# Patient Record
Sex: Female | Born: 1935 | Race: White | Hispanic: No | Smoking: Never smoker
Health system: Southern US, Community
[De-identification: ages and names within clinical notes are randomized; demographics above are authoritative.]

## PROBLEM LIST (undated history)

## (undated) DIAGNOSIS — Z5189 Encounter for other specified aftercare: Secondary | ICD-10-CM

## (undated) DIAGNOSIS — I10 Essential (primary) hypertension: Secondary | ICD-10-CM

## (undated) DIAGNOSIS — E079 Disorder of thyroid, unspecified: Secondary | ICD-10-CM

## (undated) HISTORY — DX: Encounter for other specified aftercare: Z51.89

## (undated) HISTORY — PX: APPENDECTOMY: SHX54

## (undated) HISTORY — PX: ABDOMINAL HYSTERECTOMY: SHX81

## (undated) HISTORY — PX: TONSILLECTOMY: SUR1361

---

## 2015-09-19 ENCOUNTER — Encounter (HOSPITAL_BASED_OUTPATIENT_CLINIC_OR_DEPARTMENT_OTHER): Payer: Self-pay | Admitting: *Deleted

## 2015-09-19 ENCOUNTER — Emergency Department (HOSPITAL_BASED_OUTPATIENT_CLINIC_OR_DEPARTMENT_OTHER)
Admission: EM | Admit: 2015-09-19 | Discharge: 2015-09-19 | Disposition: A | Discharge status: Home / Self Care | Payer: Medicare Other | Attending: Emergency Medicine | Admitting: Emergency Medicine

## 2015-09-19 ENCOUNTER — Emergency Department (HOSPITAL_BASED_OUTPATIENT_CLINIC_OR_DEPARTMENT_OTHER): Payer: Medicare Other

## 2015-09-19 DIAGNOSIS — S80212A Abrasion, left knee, initial encounter: Secondary | ICD-10-CM | POA: Insufficient documentation

## 2015-09-19 DIAGNOSIS — W0110XA Fall on same level from slipping, tripping and stumbling with subsequent striking against unspecified object, initial encounter: Secondary | ICD-10-CM | POA: Insufficient documentation

## 2015-09-19 DIAGNOSIS — T07XXXA Unspecified multiple injuries, initial encounter: Secondary | ICD-10-CM

## 2015-09-19 DIAGNOSIS — Y929 Unspecified place or not applicable: Secondary | ICD-10-CM | POA: Insufficient documentation

## 2015-09-19 DIAGNOSIS — Z79899 Other long term (current) drug therapy: Secondary | ICD-10-CM | POA: Insufficient documentation

## 2015-09-19 DIAGNOSIS — Y939 Activity, unspecified: Secondary | ICD-10-CM | POA: Insufficient documentation

## 2015-09-19 DIAGNOSIS — S0081XA Abrasion of other part of head, initial encounter: Secondary | ICD-10-CM | POA: Insufficient documentation

## 2015-09-19 DIAGNOSIS — Y999 Unspecified external cause status: Secondary | ICD-10-CM | POA: Diagnosis not present

## 2015-09-19 DIAGNOSIS — S022XXA Fracture of nasal bones, initial encounter for closed fracture: Secondary | ICD-10-CM | POA: Insufficient documentation

## 2015-09-19 DIAGNOSIS — S8992XA Unspecified injury of left lower leg, initial encounter: Secondary | ICD-10-CM

## 2015-09-19 DIAGNOSIS — S0993XA Unspecified injury of face, initial encounter: Secondary | ICD-10-CM | POA: Diagnosis present

## 2015-09-19 DIAGNOSIS — I10 Essential (primary) hypertension: Secondary | ICD-10-CM | POA: Insufficient documentation

## 2015-09-19 DIAGNOSIS — W19XXXA Unspecified fall, initial encounter: Secondary | ICD-10-CM

## 2015-09-19 DIAGNOSIS — S0083XA Contusion of other part of head, initial encounter: Secondary | ICD-10-CM | POA: Insufficient documentation

## 2015-09-19 HISTORY — DX: Disorder of thyroid, unspecified: E07.9

## 2015-09-19 HISTORY — DX: Essential (primary) hypertension: I10

## 2015-09-19 MED ORDER — TRAMADOL HCL 50 MG PO TABS: 50.0000 mg | ORAL_TABLET | Freq: Four times a day (QID) | ORAL | 0 refills | Status: DC | PRN

## 2015-09-19 MED FILL — traMADol HCL 50 MG TABS: 50 | 3 days supply | Qty: 15 | Fill #0

## 2015-09-19 NOTE — ED Provider Notes (Signed)
[facility] Provider Note   CSN: [id number] Arrival date & time: 09/19/15  1002  First Provider Contact:  First MD Initiated Contact with Patient 09/19/15 1010        History   Chief Complaint Chief Complaint  Patient presents with  . Fall  . Facial Injury    HPI Jennifer Gallegos is a 82 y.o. female.  82 year old the female was walking her dog stumbled and fell on the concrete hitting her face and left knee. No loss of consciousness. Patient's tetanus is up-to-date. Patient states not on any blood thinners. Patient had bleeding from her right side of her nose. That is now stopped. Patient denies any neck pain back pain. Patient was able to get up on her own and walk after the fall. Denies any hip pain. No abdominal pain no chest pain no shortness of breath.      Past Medical History:  Diagnosis Date  . Hypertension   . Thyroid disease     There are no active problems to display for this patient.   Past Surgical History:  Procedure Laterality Date  . ABDOMINAL HYSTERECTOMY    . APPENDECTOMY    . TONSILLECTOMY      OB History    No data available       Home Medications    Prior to Admission medications   Medication Sig Start Date End Date Taking? Authorizing Provider  amLODipine-benazepril (LOTREL) 5-10 MG capsule Take 1 capsule by mouth daily.   Yes Historical Provider, MD  levothyroxine (SYNTHROID, LEVOTHROID) 88 MCG tablet Take 88 mcg by mouth daily before breakfast.   Yes Historical Provider, MD  traMADol (ULTRAM) 50 MG tablet Take 1 tablet (50 mg total) by mouth every 6 (six) hours as needed. 09/19/15   Vanetta Mulders, MD    Family History No family history on file.  Social History Social History  Substance Use Topics  . Smoking status: Not on file  . Smokeless tobacco: Never Used  . Alcohol use Not on file     Allergies   Penicillins; Macrobid [nitrofurantoin]; and Sulfa antibiotics   Review of Systems Review of Systems    Constitutional: Negative for fever.  HENT: Positive for hearing loss and nosebleeds. Negative for congestion.   Eyes: Negative for visual disturbance.  Respiratory: Negative for shortness of breath.   Cardiovascular: Negative for chest pain.  Gastrointestinal: Negative for abdominal pain.  Genitourinary: Negative for dysuria.  Musculoskeletal: Negative for back pain and neck pain.  Skin: Positive for wound.  Neurological: Negative for headaches.  Hematological: Does not bruise/bleed easily.  Psychiatric/Behavioral: Negative for confusion.     Physical Exam Updated Vital Signs BP 198/79 (BP Location: Right Arm)   Pulse 83   Temp 97.7 F (36.5 C) (Oral)   Resp 18   Ht 5\' 4"  (1.626 m)   Wt 48.5 kg   BMI 18.37 kg/m   Physical Exam  Constitutional: She is oriented to person, place, and time. She appears well-developed and well-nourished. No distress.  HENT:  Head: Normocephalic.  Mouth/Throat: Oropharynx is clear and moist.  Multiple abrasions anterior part of the face bilateral ecchymosis below both eyes. Abrasion of the forehead several abrasions to the bridge of the nose. No active bleeding from the nares. No septal hematoma. Oropharynx is clear. Patient also with abrasions on the chin.  Eyes: Conjunctivae and EOM are normal. Pupils are equal, round, and reactive to light.  No anterior hyphemas  Neck: Normal range of motion. Neck  supple.  Cardiovascular: Normal rate, regular rhythm and normal heart sounds.   Pulmonary/Chest: Effort normal and breath sounds normal.  Abdominal: Soft. Bowel sounds are normal. There is no tenderness.  Musculoskeletal: She exhibits edema and tenderness.  Swelling and abrasion to the left anterior knee. No evidence of significant effusion. Good range of motion. No patellar deformity.  Neurological: She is alert and oriented to person, place, and time. She displays normal reflexes. No cranial nerve deficit. She exhibits normal muscle tone.  Patient  does have bilateral hearing aids so technically he does have a hearing deficit. But hears well with the hearing aids.  Skin: Skin is warm.  Nursing note and vitals reviewed.    ED Treatments / Results  Labs (all labs ordered are listed, but only abnormal results are displayed) Labs Reviewed - No data to display  EKG  EKG Interpretation None       Radiology Ct Head Wo Contrast  Result Date: 09/19/2015 CLINICAL DATA:  Trip and fall with facial swelling and abrasions, initial encounter EXAM: CT HEAD WITHOUT CONTRAST CT MAXILLOFACIAL WITHOUT CONTRAST CT CERVICAL SPINE WITHOUT CONTRAST TECHNIQUE: Multidetector CT imaging of the head, cervical spine, and maxillofacial structures were performed using the standard protocol without intravenous contrast. Multiplanar CT image reconstructions of the cervical spine and maxillofacial structures were also generated. COMPARISON:  None. FINDINGS: CT HEAD FINDINGS Bony calvarium is intact. Mild left forehead hematoma is seen. Diffuse atrophic changes are noted. Chronic white matter ischemic change is seen as well. Basal ganglia calcifications are noted. No findings to suggest acute hemorrhage, acute infarction or space-occupying mass lesion are seen. CT MAXILLOFACIAL FINDINGS Multiple nasal bone fractures are noted with soft tissue swelling over the bridge of the nose. No other fracture is identified. Paranasal sinuses are within normal limits. The orbits and their contents are unremarkable. No other significant soft tissue abnormality is seen. CT CERVICAL SPINE FINDINGS Seven cervical segments are well visualized. Vertebral body height is well maintained. Mild facet hypertrophic changes are noted. No acute fracture or acute facet abnormality is seen. The surrounding soft tissue structures demonstrate evidence of a large 6.3 by 4.9 cm left thyroid mass. By history patient has known thyroid disease. Clinical correlation is recommended. IMPRESSION: CT of the head:  Atrophic and ischemic changes without acute intracranial abnormality. CT of the maxillofacial bones: Multiple nasal bone fractures are noted with overlying soft tissue swelling. A left frontal soft tissue hematoma is noted as well. CT of the cervical spine: Degenerative change without acute bony abnormality. A large left thyroid mass is noted. Correlation with the clinical history is recommended. Electronically Signed   By: Alcide CleverMark  Lukens M.D.   On: 09/19/2015 11:20   Ct Cervical Spine Wo Contrast  Result Date: 09/19/2015 CLINICAL DATA:  Trip and fall with facial swelling and abrasions, initial encounter EXAM: CT HEAD WITHOUT CONTRAST CT MAXILLOFACIAL WITHOUT CONTRAST CT CERVICAL SPINE WITHOUT CONTRAST TECHNIQUE: Multidetector CT imaging of the head, cervical spine, and maxillofacial structures were performed using the standard protocol without intravenous contrast. Multiplanar CT image reconstructions of the cervical spine and maxillofacial structures were also generated. COMPARISON:  None. FINDINGS: CT HEAD FINDINGS Bony calvarium is intact. Mild left forehead hematoma is seen. Diffuse atrophic changes are noted. Chronic white matter ischemic change is seen as well. Basal ganglia calcifications are noted. No findings to suggest acute hemorrhage, acute infarction or space-occupying mass lesion are seen. CT MAXILLOFACIAL FINDINGS Multiple nasal bone fractures are noted with soft tissue swelling over the bridge  of the nose. No other fracture is identified. Paranasal sinuses are within normal limits. The orbits and their contents are unremarkable. No other significant soft tissue abnormality is seen. CT CERVICAL SPINE FINDINGS Seven cervical segments are well visualized. Vertebral body height is well maintained. Mild facet hypertrophic changes are noted. No acute fracture or acute facet abnormality is seen. The surrounding soft tissue structures demonstrate evidence of a large 6.3 by 4.9 cm left thyroid mass. By  history patient has known thyroid disease. Clinical correlation is recommended. IMPRESSION: CT of the head: Atrophic and ischemic changes without acute intracranial abnormality. CT of the maxillofacial bones: Multiple nasal bone fractures are noted with overlying soft tissue swelling. A left frontal soft tissue hematoma is noted as well. CT of the cervical spine: Degenerative change without acute bony abnormality. A large left thyroid mass is noted. Correlation with the clinical history is recommended. Electronically Signed   By: Alcide Clever M.D.   On: 09/19/2015 11:20   Dg Knee Complete 4 Views Left  Result Date: 09/19/2015 CLINICAL DATA:  Fall, tripped on cement EXAM: LEFT KNEE - COMPLETE 4+ VIEW COMPARISON:  None. FINDINGS: Four views of the left knee submitted. No acute fracture or subluxation. There is diffuse osteopenia. Small joint effusion. Prepatellar soft tissue swelling. Mild spurring of patella. Narrowing of medial joint compartment. Mild spurring of medial femoral condyle and medial tibial plateau. Narrowing of patellofemoral joint space. IMPRESSION: No acute fracture or subluxation. Small joint effusion. Diffuse osteopenia. Degenerative changes as described above. Prepatellar soft tissue swelling. Electronically Signed   By: Natasha Mead M.D.   On: 09/19/2015 11:15   Ct Maxillofacial Wo Cm  Result Date: 09/19/2015 CLINICAL DATA:  Trip and fall with facial swelling and abrasions, initial encounter EXAM: CT HEAD WITHOUT CONTRAST CT MAXILLOFACIAL WITHOUT CONTRAST CT CERVICAL SPINE WITHOUT CONTRAST TECHNIQUE: Multidetector CT imaging of the head, cervical spine, and maxillofacial structures were performed using the standard protocol without intravenous contrast. Multiplanar CT image reconstructions of the cervical spine and maxillofacial structures were also generated. COMPARISON:  None. FINDINGS: CT HEAD FINDINGS Bony calvarium is intact. Mild left forehead hematoma is seen. Diffuse atrophic  changes are noted. Chronic white matter ischemic change is seen as well. Basal ganglia calcifications are noted. No findings to suggest acute hemorrhage, acute infarction or space-occupying mass lesion are seen. CT MAXILLOFACIAL FINDINGS Multiple nasal bone fractures are noted with soft tissue swelling over the bridge of the nose. No other fracture is identified. Paranasal sinuses are within normal limits. The orbits and their contents are unremarkable. No other significant soft tissue abnormality is seen. CT CERVICAL SPINE FINDINGS Seven cervical segments are well visualized. Vertebral body height is well maintained. Mild facet hypertrophic changes are noted. No acute fracture or acute facet abnormality is seen. The surrounding soft tissue structures demonstrate evidence of a large 6.3 by 4.9 cm left thyroid mass. By history patient has known thyroid disease. Clinical correlation is recommended. IMPRESSION: CT of the head: Atrophic and ischemic changes without acute intracranial abnormality. CT of the maxillofacial bones: Multiple nasal bone fractures are noted with overlying soft tissue swelling. A left frontal soft tissue hematoma is noted as well. CT of the cervical spine: Degenerative change without acute bony abnormality. A large left thyroid mass is noted. Correlation with the clinical history is recommended. Electronically Signed   By: Alcide Clever M.D.   On: 09/19/2015 11:20    Procedures Procedures (including critical care time)  Medications Ordered in ED Medications - No  data to display   Initial Impression / Assessment and Plan / ED Course  I have reviewed the triage vital signs and the nursing notes.  Pertinent labs & imaging results that were available during my care of the patient were reviewed by me and considered in my medical decision making (see chart for details).  Clinical Course    Patient stumbled walking her dog. Landing on her face on the concrete. Patient was facial  abrasions and bruising as well as bleeding from the right nares which has stopped. Also complaint of mild discomfort to the left knee slight swelling there with an abrasion on the skin.  CT of the head and neck without any acute findings. X-ray of left knee without any bony injuries. CT of face shows multiple nasal bone fractures. No evidence of any open fracture by exam.  Patient will be discharged home with local wound care for the abrasions to the face and follow up with ear nose and throat. Prescription for tramadol provided. Patient did not have any loss of consciousness.  Final Clinical Impressions(s) / ED Diagnoses   Final diagnoses:  Fall, initial encounter  Multiple abrasions  Nasal bone fractures, closed, initial encounter  Knee injury, left, initial encounter    New Prescriptions New Prescriptions   TRAMADOL (ULTRAM) 50 MG TABLET    Take 1 tablet (50 mg total) by mouth every 6 (six) hours as needed.     Vanetta Mulders, MD 09/19/15 1157

## 2015-09-19 NOTE — ED Triage Notes (Signed)
Pt states she tripped on cement and fell face first to cement. Bruising to bil eyes and abrasion to bridge of nose, upper lip, chin, left palm of hand and left knee. Swelling to left knee. No LOC.

## 2015-09-19 NOTE — Discharge Instructions (Signed)
CT scan of the face shows multiple broken bones in the nose. Follow up with ear nose and throat is important. For the abrasions on the face local wound care. No evidence of any broken bone in the knee.

## 2017-10-27 DIAGNOSIS — R269 Unspecified abnormalities of gait and mobility: Secondary | ICD-10-CM | POA: Insufficient documentation

## 2017-10-27 DIAGNOSIS — M810 Age-related osteoporosis without current pathological fracture: Secondary | ICD-10-CM | POA: Insufficient documentation

## 2019-02-07 ENCOUNTER — Other Ambulatory Visit: Payer: Self-pay

## 2019-02-07 ENCOUNTER — Emergency Department (HOSPITAL_BASED_OUTPATIENT_CLINIC_OR_DEPARTMENT_OTHER)
Admission: EM | Admit: 2019-02-07 | Discharge: 2019-02-07 | Disposition: A | Discharge status: Home / Self Care | Payer: Medicare Other | Attending: Emergency Medicine | Admitting: Emergency Medicine

## 2019-02-07 ENCOUNTER — Encounter (HOSPITAL_BASED_OUTPATIENT_CLINIC_OR_DEPARTMENT_OTHER): Payer: Self-pay | Admitting: *Deleted

## 2019-02-07 ENCOUNTER — Emergency Department (HOSPITAL_BASED_OUTPATIENT_CLINIC_OR_DEPARTMENT_OTHER): Payer: Medicare Other

## 2019-02-07 DIAGNOSIS — R1084 Generalized abdominal pain: Secondary | ICD-10-CM | POA: Diagnosis present

## 2019-02-07 DIAGNOSIS — N390 Urinary tract infection, site not specified: Secondary | ICD-10-CM

## 2019-02-07 DIAGNOSIS — Z79899 Other long term (current) drug therapy: Secondary | ICD-10-CM | POA: Diagnosis not present

## 2019-02-07 DIAGNOSIS — I10 Essential (primary) hypertension: Secondary | ICD-10-CM | POA: Diagnosis not present

## 2019-02-07 DIAGNOSIS — K5900 Constipation, unspecified: Secondary | ICD-10-CM | POA: Insufficient documentation

## 2019-02-07 LAB — COMPREHENSIVE METABOLIC PANEL
ALT: 13 U/L (ref 0–44)
AST: 15 U/L (ref 15–41)
Albumin: 4.5 g/dL (ref 3.5–5.0)
Alkaline Phosphatase: 84 U/L (ref 38–126)
Anion gap: 12 (ref 5–15)
BUN: 20 mg/dL (ref 8–23)
CO2: 23 mmol/L (ref 22–32)
Calcium: 9.3 mg/dL (ref 8.9–10.3)
Chloride: 99 mmol/L (ref 98–111)
Creatinine, Ser: 1.06 mg/dL — ABNORMAL HIGH (ref 0.44–1.00)
GFR calc Af Amer: 51 mL/min — ABNORMAL LOW (ref >60)
GFR calc non Af Amer: 44 mL/min — ABNORMAL LOW (ref >60)
Glucose, Bld: 171 mg/dL — ABNORMAL HIGH (ref 70–99)
Potassium: 3.8 mmol/L (ref 3.5–5.1)
Sodium: 134 mmol/L — ABNORMAL LOW (ref 135–145)
Total Bilirubin: 0.7 mg/dL (ref 0.3–1.2)
Total Protein: 7.6 g/dL (ref 6.5–8.1)

## 2019-02-07 LAB — CBC
HCT: 47.2 % — ABNORMAL HIGH (ref 36.0–46.0)
Hemoglobin: 15.5 g/dL — ABNORMAL HIGH (ref 12.0–15.0)
MCH: 29.6 pg (ref 26.0–34.0)
MCHC: 32.8 g/dL (ref 30.0–36.0)
MCV: 90.1 fL (ref 80.0–100.0)
Platelets: 308 10*3/uL (ref 150–400)
RBC: 5.24 MIL/uL — ABNORMAL HIGH (ref 3.87–5.11)
RDW: 13.4 % (ref 11.5–15.5)
WBC: 11.5 10*3/uL — ABNORMAL HIGH (ref 4.0–10.5)
nRBC: 0 % (ref 0.0–0.2)

## 2019-02-07 LAB — URINALYSIS, ROUTINE W REFLEX MICROSCOPIC
Bilirubin Urine: NEGATIVE
Glucose, UA: 100 mg/dL — AB
Ketones, ur: NEGATIVE mg/dL
Nitrite: POSITIVE — AB
Protein, ur: 30 mg/dL — AB
Specific Gravity, Urine: 1.025 (ref 1.005–1.030)
pH: 5.5 (ref 5.0–8.0)

## 2019-02-07 LAB — URINALYSIS, MICROSCOPIC (REFLEX): WBC, UA: >50 WBC/hpf (ref 0–5)

## 2019-02-07 LAB — LIPASE, BLOOD: Lipase: 31 U/L (ref 11–51)

## 2019-02-07 MED ORDER — CEPHALEXIN 500 MG PO CAPS: 500.0000 mg | ORAL_CAPSULE | Freq: Three times a day (TID) | ORAL | 0 refills | Status: AC

## 2019-02-07 MED ORDER — IOHEXOL 300 MG/ML  SOLN
100.0000 mL | Freq: Once | INTRAMUSCULAR | Status: AC | PRN
  Administered 2019-02-07: 100 mL via INTRAVENOUS

## 2019-02-07 NOTE — ED Notes (Signed)
DC instructions and Rx reviewed with pt son, due to pt being very HOH. Rx x 1 provided to son and opportunity for questions provided

## 2019-02-07 NOTE — ED Notes (Signed)
C/o abd pain since christmas eve. Denies any N/V/D. States she has been constipated and trying OTC meds w/o results

## 2019-02-07 NOTE — Discharge Instructions (Addendum)
Recommend taking MiraLAX twice daily for the next week.  Recommend taking the antibiotic as prescribed.  Recommend calling primary doctor to schedule a recheck in 1 week.  Additionally recommend scheduling follow-up with a gastroenterologist.  If you develop worsening pain, fever or other new concerning symptom, recommend return to ER for reassessment.

## 2019-02-07 NOTE — ED Notes (Signed)
INSTRUCTED TO BE NPO UNTIL FURTHER NOTICE

## 2019-02-07 NOTE — ED Provider Notes (Signed)
[place] EMERGENCY DEPARTMENT Provider Note   CSN: [id number] Arrival date & time: 02/07/19  1210     History Chief Complaint  Patient presents with  . Abdominal Pain  . Back Pain  . Constipation    Jennifer Gallegos is a 85 y.o. female.  Presents to the emergency department with chief complaint of abdominal pain, back pain and constipation.  Patient states symptoms have been going on for last few days, seem to come and go at random, describes pain as generalized, cramping.  Also concerned she has been more constipated than normal.  Had tried something over-the-counter without improvement.  Cannot recall what medication it was.  Last bowel movement 5 days ago.  No nausea, vomiting or diarrhea.  States at present she has no ongoing pain.  No dysuria or hematuria.  HPI     Past Medical History:  Diagnosis Date  . Hypertension   . Thyroid disease     There are no problems to display for this patient.   Past Surgical History:  Procedure Laterality Date  . ABDOMINAL HYSTERECTOMY    . APPENDECTOMY    . TONSILLECTOMY       OB History   No obstetric history on file.     No family history on file.  Social History   Tobacco Use  . Smoking status: Never Smoker  . Smokeless tobacco: Never Used  Substance Use Topics  . Alcohol use: Never  . Drug use: Never    Home Medications Prior to Admission medications   Medication Sig Start Date End Date Taking? Authorizing Provider  amLODipine-benazepril (LOTREL) 5-10 MG capsule Take 1 capsule by mouth daily.   Yes [provider]  levothyroxine (SYNTHROID, LEVOTHROID) 88 MCG tablet Take 88 mcg by mouth daily before breakfast.   Yes [provider]  cephALEXin (KEFLEX) 500 MG capsule Take 1 capsule (500 mg total) by mouth 3 (three) times daily for 7 days. 02/07/19 02/14/19  Lucrezia Starch, MD  traMADol (ULTRAM) 50 MG tablet Take 1 tablet (50 mg total) by mouth every 6 (six) hours as needed. 09/19/15    Fredia Sorrow, MD    Allergies    Penicillins, Macrobid [nitrofurantoin], and Sulfa antibiotics  Review of Systems   Review of Systems  Constitutional: Negative for chills and fever.  HENT: Negative for ear pain and sore throat.   Eyes: Negative for pain and visual disturbance.  Respiratory: Negative for cough and shortness of breath.   Cardiovascular: Negative for chest pain and palpitations.  Gastrointestinal: Positive for abdominal pain and constipation. Negative for vomiting.  Genitourinary: Negative for dysuria and hematuria.  Musculoskeletal: Negative for arthralgias and back pain.  Skin: Negative for color change and rash.  Neurological: Negative for seizures and syncope.  All other systems reviewed and are negative.   Physical Exam Updated Vital Signs BP 140/89 (BP Location: Right Arm)   Pulse 89   Temp 99 F (37.2 C) (Oral)   Resp 17   Ht 5' (1.524 m)   Wt 44.5 kg   SpO2 98%   BMI 19.14 kg/m   Physical Exam Vitals and nursing note reviewed.  Constitutional:      General: She is not in acute distress.    Appearance: She is well-developed.  HENT:     Head: Normocephalic and atraumatic.  Eyes:     Conjunctiva/sclera: Conjunctivae normal.  Cardiovascular:     Rate and Rhythm: Normal rate and regular rhythm.     Heart sounds:  No murmur.  Pulmonary:     Effort: Pulmonary effort is normal. No respiratory distress.     Breath sounds: Normal breath sounds.  Abdominal:     General: Bowel sounds are normal.     Palpations: Abdomen is soft.     Tenderness: There is no abdominal tenderness. There is no right CVA tenderness or left CVA tenderness.  Musculoskeletal:     Cervical back: Neck supple.  Skin:    General: Skin is warm and dry.  Neurological:     Mental Status: She is alert.     ED Results / Procedures / Treatments   Labs (all labs ordered are listed, but only abnormal results are displayed) Labs Reviewed  COMPREHENSIVE METABOLIC PANEL -  Abnormal; Notable for the following components:      Result Value   Sodium 134 (*)    Glucose, Bld 171 (*)    Creatinine, Ser 1.06 (*)    GFR calc non Af Amer 44 (*)    GFR calc Af Amer 51 (*)    All other components within normal limits  CBC - Abnormal; Notable for the following components:   WBC 11.5 (*)    RBC 5.24 (*)    Hemoglobin 15.5 (*)    HCT 47.2 (*)    All other components within normal limits  URINALYSIS, ROUTINE W REFLEX MICROSCOPIC - Abnormal; Notable for the following components:   APPearance CLOUDY (*)    Glucose, UA 100 (*)    Hgb urine dipstick SMALL (*)    Protein, ur 30 (*)    Nitrite POSITIVE (*)    Leukocytes,Ua MODERATE (*)    All other components within normal limits  URINALYSIS, MICROSCOPIC (REFLEX) - Abnormal; Notable for the following components:   Bacteria, UA MANY (*)    All other components within normal limits  LIPASE, BLOOD    EKG None  Radiology CT ABDOMEN PELVIS W CONTRAST  Result Date: 02/07/2019 CLINICAL DATA:  Left upper quadrant abdominal pain. Concern for diverticulitis or pancreatitis. EXAM: CT ABDOMEN AND PELVIS WITH CONTRAST TECHNIQUE: Multidetector CT imaging of the abdomen and pelvis was performed using the standard protocol following bolus administration of intravenous contrast. CONTRAST:  100mL OMNIPAQUE IOHEXOL 300 MG/ML  SOLN COMPARISON:  None. FINDINGS: Lower chest: There is atelectasis at the lung bases.The heart is enlarged. Hepatobiliary: The liver is normal. Normal gallbladder.There is no biliary ductal dilation. Pancreas: Normal contours without ductal dilatation. No peripancreatic fluid collection. Spleen: No splenic laceration or hematoma. Adrenals/Urinary Tract: --Adrenal glands: No adrenal hemorrhage. --Right kidney/ureter: No hydronephrosis or perinephric hematoma. --Left kidney/ureter: No hydronephrosis or perinephric hematoma. --Urinary bladder: Unremarkable. Stomach/Bowel: --Stomach/Duodenum: No hiatal hernia or other  gastric abnormality. Normal duodenal course and caliber. --Small bowel: No dilatation or inflammation. --Colon: There is sigmoid diverticulosis without CT evidence for diverticulitis. There is a large amount of stool throughout the colon. There is a focal long segment area of narrowing involving the transverse colon (axial series 2, image 48). --Appendix: Not visualized. No right lower quadrant inflammation or free fluid. Vascular/Lymphatic: Atherosclerotic calcification is present within the non-aneurysmal abdominal aorta, without hemodynamically significant stenosis. --No retroperitoneal lymphadenopathy. --No mesenteric lymphadenopathy. --No pelvic or inguinal lymphadenopathy. Reproductive: Status post hysterectomy. No adnexal mass. There is significant pelvic floor laxity. Other: No ascites or free air. The abdominal wall is normal. Musculoskeletal. There is chronic appearing height loss of the L3 and L1 vertebral bodies. There is no significant retropulsion. There is no obvious acute displaced fracture on this  exam. IMPRESSION: 1. No acute abdominopelvic abnormality. 2. Large amount of stool throughout the colon. There is a focal long segment area of narrowing involving the transverse colon. While this could be secondary to underdistention, a colonic mass is not excluded. Correlation with colonoscopy is recommended. 3. Sigmoid diverticulosis without CT evidence for diverticulitis. 4. Large stool burden. 5. Cardiomegaly. 6. Pelvic floor laxity. 7. Chronic appearing height loss of the L3 and L1 vertebral bodies. Aortic Atherosclerosis (ICD10-I70.0). Electronically Signed   By: Katherine Mantle M.D.   On: 02/07/2019 16:44    Procedures Procedures (including critical care time)  Medications Ordered in ED Medications  iohexol (OMNIPAQUE) 300 MG/ML solution 100 mL (100 mLs Intravenous Contrast Given 02/07/19 1624)    ED Course  I have reviewed the triage vital signs and the nursing notes.  Pertinent  labs & imaging results that were available during my care of the patient were reviewed by me and considered in my medical decision making (see chart for details).    MDM Rules/Calculators/A&P                      85 year old lady presents to ER with abdominal pain.  On exam she is well-appearing, no focal tenderness noted, her pain had actually resolved when I performed my initial assessment.  Given age, proceeded with CT scan to evaluate for any acute pathology.  No acute abdominal pelvic pathology noted.  Radiologist did note large stool burden in colon.  Suspect this is the most likely etiology for her symptoms.  Recommended aggressive bowel regimen with MiraLAX or milk of magnesia.  Additionally recommended recheck with primary doctor.  The radiologist also noted focal long segment area of narrowing involving transverse colon and colonic mass cannot be excluded.  Discussed this finding with patient and her son, recommended discussion with gastroenterology and consideration for colonoscopy.  UA concerning for possible infection. Tolerating PO without difficulty. Will dc home.  Will give rx for pos UTI.    After the discussed management above, the patient was determined to be safe for discharge.  The patient was in agreement with this plan and all questions regarding their care were answered.  ED return precautions were discussed and the patient will return to the ED with any significant worsening of condition.   Final Clinical Impression(s) / ED Diagnoses Final diagnoses:  Constipation, unspecified constipation type  Urinary tract infection without hematuria, site unspecified    Rx / DC Orders ED Discharge Orders         Ordered    cephALEXin (KEFLEX) 500 MG capsule  3 times daily     02/07/19 1712           Milagros Loll, MD 02/07/19 2345

## 2019-02-07 NOTE — ED Triage Notes (Signed)
Abdominal pain, back pain and constipation. Last bowel movement was 5 days ago.

## 2019-02-07 NOTE — ED Notes (Signed)
Also states last BM was not usual for her. Very sm and very hard per her statement

## 2019-02-07 NOTE — ED Notes (Signed)
Family at bedside. 

## 2019-02-07 NOTE — ED Notes (Signed)
Returns from CT scan, Hourly Rounding performed.

## 2019-02-07 NOTE — ED Notes (Signed)
ED Provider at bedside. 

## 2019-02-18 NOTE — Progress Notes (Signed)
02/18/2019 Jennifer Gallegos [id number] May 07, 1919   HISTORY OF PRESENT ILLNESS: Jennifer Gallegos is a 86 year old female with a past medical history of hypertension and  Hypothyroidism. Past tonsillectomy, appendectomy, hysterectomy and bladder lift surgery.  She developed upper abdominal pain on 02/02/2019.  No associated nausea or vomiting.  She took Ibuprofen 200 mg 1 tab every 3-4 hours which resulted in constipation. She usually passes a normal brown BM once or twice daily.  She went 2 to 3 days without passing a bowel movement.  Her upper abdominal pain which radiated under the right and left rib cage worsened and she presented to she to [facility] on 02/07/2019.  Labs showed WBC 11.5. Hg 15.5. HCT 47.2. PLT 308. Na 134. K 3.8. BUN 20. Cr. 1.06. T. Bili 07. Alk phos 84. AST 15. ALT 13. An abdominal/pelvic CT with contrast identified a large stool burden in the colon, sigmoid diverticulosis without evidence of diverticulitis and a focal long segment area of narrowing involving the transverse colon, a colonic mass could not be excluded. She was advised to follow up with GI for a possible colonoscopy. A urinalysis showed a possible UTI therefore she was prescribed Keflex 598m 1 po tid. she presents today accompanied by her son Jennifer Glaserfor further GI evaluation.  She continues to have pain across her upper abdomen which was worse yesterday evening.  She slept sitting up and she is having less abdominal pain today.  He describes the pain as sharp and sometimes a dull ache.  No fever, sweats or chills.  No nausea or vomiting.  She is no longer constipated.  She is taking MiraLAX daily.  She last passed a normal formed brown bowel movement yesterday.  She noticed a scant amount of red blood on the toilet tissue yesterday.  No melena.  No BM yet today. She denies ever having a screening colonoscopy. She denies having any dysphagia or heartburn.  No fever, sweats or chills.  No weight loss.  No  family history of stomach or colorectal cancer.  Her mother died in her 823sfrom liver or pancreatic cancer.  Abdominal/pelvic CT with contrast 02/07/2019: 1. No acute abdominopelvic abnormality. 2. Large amount of stool throughout the colon. There is a focal long segment area of narrowing involving the transverse colon. While this could be secondary to underdistention, a colonic mass is not excluded. Correlation with colonoscopy is recommended. 3. Sigmoid diverticulosis without CT evidence for diverticulitis. 4. Large stool burden. 5. Cardiomegaly. 6. Pelvic floor laxity. 7. Chronic appearing height loss of the L3 and L1 vertebral bodies. 8. Aortic Atherosclerosis   Past Medical History:  Diagnosis Date  . Hypertension   . Thyroid disease    Past Surgical History:  Procedure Laterality Date  . ABDOMINAL HYSTERECTOMY    . APPENDECTOMY    . TONSILLECTOMY      reports that she has never smoked. She has never used smokeless tobacco. She reports that she does not drink alcohol or use drugs. family history is not on file. Allergies  Allergen Reactions  . Penicillins Itching  . Macrobid [Nitrofurantoin] Rash  . Sulfa Antibiotics Rash      Outpatient Encounter Medications as of 02/20/2019  Medication Sig  . amLODipine-benazepril (LOTREL) 5-10 MG capsule Take 1 capsule by mouth daily.  .Marland Kitchenlevothyroxine (SYNTHROID, LEVOTHROID) 88 MCG tablet Take 88 mcg by mouth daily before breakfast.  . traMADol (ULTRAM) 50 MG tablet Take 1 tablet (50 mg total) by  mouth every 6 (six) hours as needed.   No facility-administered encounter medications on file as of 02/20/2019.    Review of Systems: Gen: Denies fever, sweats or chills. No weight loss.  CV: Denies chest pain, palpitations or edema. Resp: Denies cough, shortness of breath of hemoptysis.  GI: See HPI. GU : + Urinary incontinence, no dysuria. MS: Denies joint pain, muscles aches or weakness. Derm: Denies rash, itchiness, skin lesions  or unhealing ulcers. Psych: Denies depression, Jennifer Gallegos or memory loss. Heme: Denies bruising, or easy bleeding. Neuro:  Denies headaches, dizziness or paresthesias. Endo:  Denies any problems with DM, thyroid or adrenal function.   PHYSICAL EXAM: BP 124/60   Pulse 75   Temp 97.8 F (36.6 C)   Wt 99 lb (44.9 kg)   BMI 19.33 kg/m  General: Well developed 86 year old female HOH in no acute distress Head: Normocephalic and atraumatic Eyes:  sclerae anicteric, conjunctive pink. Ears: HOH. Mouth: Upper denture plate. No ulcers or lesions.  Neck: Supple, no masses.  Lungs: Clear throughout to auscultation Heart: Regular rate and rhythm, II/VI systolic murmur.  Abdomen: Soft, flat, nontender, non distended. No masses or hepatomegaly noted. Normal bowel sounds x 4 quadrants.  Rectal: Deferred.  Musculoskeletal: Symmetrical with no gross deformities  Skin: No lesions on visible extremities Extremities: No edema  Neurological: Alert oriented x 4, grossly nonfocal Cervical Nodes:  No significant cervical adenopathy Psychological:  Alert and cooperative. Normal mood and affect  ASSESSMENT AND PLAN:  82. 86 year old female with constipation and upper abdominal pain. CTAP with contrast 12/29 identified stool burden throughout the colon, sigmoid diverticulosis and a long segment of narrowing to the transverse colon.  - I discussed her abdominal/pelvic CT results with the patient and her son Jennifer Gallegos which showed a narrowing to the transverse colon which is consistent with the area of her abdominal pain.  Typically, a colonoscopy would be deferred due to increased complications secondary to her age but due to her persistent upper abdominal pain a diagnostic colonoscopy to be considered +/- EGD.  I discussed the benefits and the risks of a diagnostic colonoscopy including risk with sedation, bleeding, infection and perforation.  I asked the patient if she was diagnosed with a colon cancer would she want to  know this diagnosis and would she want treatment/surgical intervention? The patient stated her abdominal pain is less today and she prefers to avoid any endoscopic evaluation unless absolutely necessary.  She will discuss this further with her extended family.  I also discussed repeating an abdominal/pelvic CT in 4 weeks to reassess the narrowing component to this transverse colon.  I discussed the signs of an abdominal obstruction including worsening abdominal pain, vomiting and no bowel movement or gas per the rectum and if this were to occur for the patient to present to the local emergency room. -CBC, CRP, BMP -MiraLAX 1 capful mixed in 8 ounces of water daily -I will consult with Dr. Bryan Lemma to obtain his recommendations -Patient to call our office if her abdominal pain worsens or if she develops vomiting or if constipation recurs -Follow-up in the office in 4 weeks  2. Sigmoid diverticulosis   3. Hypertension, well controlled        CC:  Kristopher Glee., MD

## 2019-02-20 ENCOUNTER — Other Ambulatory Visit: Payer: Self-pay

## 2019-02-20 ENCOUNTER — Ambulatory Visit (INDEPENDENT_AMBULATORY_CARE_PROVIDER_SITE_OTHER): Payer: Medicare Other | Admitting: Nurse Practitioner

## 2019-02-20 ENCOUNTER — Encounter: Payer: Self-pay | Admitting: Nurse Practitioner

## 2019-02-20 VITALS — BP 124/60 | HR 75 | Temp 97.8°F | Wt 99.0 lb

## 2019-02-20 DIAGNOSIS — K5900 Constipation, unspecified: Secondary | ICD-10-CM | POA: Diagnosis not present

## 2019-02-20 DIAGNOSIS — K56699 Other intestinal obstruction unspecified as to partial versus complete obstruction: Secondary | ICD-10-CM | POA: Diagnosis not present

## 2019-02-20 DIAGNOSIS — R935 Abnormal findings on diagnostic imaging of other abdominal regions, including retroperitoneum: Secondary | ICD-10-CM | POA: Diagnosis not present

## 2019-02-20 DIAGNOSIS — R1010 Upper abdominal pain, unspecified: Secondary | ICD-10-CM | POA: Diagnosis not present

## 2019-02-20 NOTE — Progress Notes (Signed)
Agree with the assessment and plan as outlined by Alcide Evener, NP.   CT independently reviewed and agree that there appears to be an area of narrowing in the transverse colon. Given patient wishes to defer any endoscopic evaluations, agree with plan for medical management of constipation with short interval (4 weeks) repeat CT to assess for changes. If ongoing sxs, or if CT similarly concerning for mass effect, would have a low threshold for colonoscopy for diagnostic intent, provided the patient and her family wish to proceed and that she would also consider surgical intervention and/or endoscopic stent placement depending on the findings, as she would remain at risk of obstruction should this be a more ominous process.   Shemeca Lukasik, DO, [facility]

## 2019-02-20 NOTE — Patient Instructions (Addendum)
If you are age 84 or older, your body mass index should be between 23-30. Your There is no height or weight on file to calculate BMI. If this is out of the aforementioned range listed, please consider follow up with your Primary Care Provider.  If you are age 31 or younger, your body mass index should be between 19-25. Your There is no height or weight on file to calculate BMI. If this is out of the aformentioned range listed, please consider follow up with your Primary Care Provider.   Your provider has requested that you have lab work done today. Please go to [facility] at [place]. [address] [place], [place] [address].  Please use Tylenol 500 mg by mouth twice a day as needed. Use Miralax daily 1 capful mixed in 8 ounces of water at bedtime. Follow up in 4 weeks. Call the office if your symptoms worsen.  You have been scheduled for a CT scan of the abdomen and pelvis at [place] [facility]. You are scheduled on 03/20/2019 at 10:00am. You should arrive 15 minutes prior to your appointment time for registration. Please follow the written instructions below on the day of your exam:  WARNING: IF YOU ARE ALLERGIC TO IODINE/X-RAY DYE, PLEASE NOTIFY RADIOLOGY IMMEDIATELY AT [phone]! YOU WILL BE GIVEN A 13 HOUR PREMEDICATION PREP.  1) Do not eat or drink anything after 6:00am (4 hours prior to your test) 2) You have been given 2 bottles of oral contrast to drink. The solution may taste better if refrigerated, but do NOT add ice or any other liquid to this solution. Shake well before drinking.    Drink 1 bottle of contrast @ 8:00 am (2 hours prior to your exam)  Drink 1 bottle of contrast @ 9:00 am (1 hour prior to your exam)  You may take any medications as prescribed with a small amount of water, if necessary. If you take any of the following medications: METFORMIN, GLUCOPHAGE, GLUCOVANCE, AVANDAMET, RIOMET, FORTAMET, [place] MET, JANUMET, GLUMETZA or METAGLIP, you MAY be asked  to HOLD this medication 48 hours AFTER the exam.  The purpose of you drinking the oral contrast is to aid in the visualization of your intestinal tract. The contrast solution may cause some diarrhea. Depending on your individual set of symptoms, you may also receive an intravenous injection of x-ray contrast/dye. Plan on being at [facility] for 30 minutes or longer, depending on the type of exam you are having performed.  This test typically takes 30-45 minutes to complete.  If you have any questions regarding your exam or if you need to reschedule, you may call the CT department at [phone] between the hours of 8:00 am and 5:00 pm, Monday-Friday.  ________________________________________________________________________    Due to recent changes in healthcare laws, you may see the results of your imaging and laboratory studies on MyChart before your provider has had a chance to review them.  We understand that in some cases there may be results that are confusing or concerning to you. Not all laboratory results come back in the same time frame and the provider may be waiting for multiple results in order to interpret others.  Please give [place] 48 hours in order for your provider to thoroughly review all the results before contacting the office for clarification of your results.   Thank you for choosing [facility] Noralyn Pick, CRNP

## 2019-02-20 NOTE — Addendum Note (Signed)
Addended by: Shellia Cleverly on: 02/20/2019 02:06 PM   Modules accepted: Level of Service

## 2019-02-27 ENCOUNTER — Encounter (HOSPITAL_BASED_OUTPATIENT_CLINIC_OR_DEPARTMENT_OTHER): Payer: Self-pay

## 2019-02-27 ENCOUNTER — Other Ambulatory Visit: Payer: Self-pay

## 2019-02-27 ENCOUNTER — Emergency Department (HOSPITAL_BASED_OUTPATIENT_CLINIC_OR_DEPARTMENT_OTHER)
Admission: EM | Admit: 2019-02-27 | Discharge: 2019-02-27 | Disposition: A | Discharge status: Home / Self Care | Payer: Medicare Other | Attending: Emergency Medicine | Admitting: Emergency Medicine

## 2019-02-27 DIAGNOSIS — E079 Disorder of thyroid, unspecified: Secondary | ICD-10-CM | POA: Insufficient documentation

## 2019-02-27 DIAGNOSIS — L03115 Cellulitis of right lower limb: Secondary | ICD-10-CM | POA: Insufficient documentation

## 2019-02-27 DIAGNOSIS — I10 Essential (primary) hypertension: Secondary | ICD-10-CM | POA: Insufficient documentation

## 2019-02-27 DIAGNOSIS — L989 Disorder of the skin and subcutaneous tissue, unspecified: Secondary | ICD-10-CM | POA: Diagnosis present

## 2019-02-27 DIAGNOSIS — L0390 Cellulitis, unspecified: Secondary | ICD-10-CM

## 2019-02-27 DIAGNOSIS — R229 Localized swelling, mass and lump, unspecified: Secondary | ICD-10-CM

## 2019-02-27 MED ORDER — CEPHALEXIN 500 MG PO CAPS: 500.0000 mg | ORAL_CAPSULE | Freq: Two times a day (BID) | ORAL | 0 refills | Status: AC

## 2019-02-27 NOTE — ED Provider Notes (Signed)
[facility] Emergency Department Provider Note MRN:  [id number]  Arrival date & time: 02/27/19     Chief Complaint   Abscess   History of Present Illness   Jennifer Gallegos is a 86 y.o. year-old female with a history of hypertension presenting to the ED with chief complaint of abscess.  Lesion to the right leg for 1 week, increasing pain to the area.  Denies fever, no nausea, no vomiting, no chest pain, no shortness of breath, no other complaints.  Pain is mild, worse with palpation.  Review of Systems  A complete 10 system review of systems was obtained and all systems are negative except as noted in the HPI and PMH.   Patient's Health History    Past Medical History:  Diagnosis Date  . Hypertension   . Thyroid disease     Past Surgical History:  Procedure Laterality Date  . ABDOMINAL HYSTERECTOMY    . APPENDECTOMY    . TONSILLECTOMY      No family history on file.  Social History   Socioeconomic History  . Marital status: Widowed    Spouse name: Not on file  . Number of children: Not on file  . Years of education: Not on file  . Highest education level: Not on file  Occupational History  . Not on file  Tobacco Use  . Smoking status: Never Smoker  . Smokeless tobacco: Never Used  Substance and Sexual Activity  . Alcohol use: Never  . Drug use: Never  . Sexual activity: Not on file  Other Topics Concern  . Not on file  Social History Narrative  . Not on file   Social Determinants of Health   Financial Resource Strain:   . Difficulty of Paying Living Expenses: Not on file  Food Insecurity:   . Worried About Programme researcher, broadcasting/film/video in the Last Year: Not on file  . Ran Out of Food in the Last Year: Not on file  Transportation Needs:   . Lack of Transportation (Medical): Not on file  . Lack of Transportation (Non-Medical): Not on file  Physical Activity:   . Days of Exercise per Week: Not on file  . Minutes of Exercise per Session:  Not on file  Stress:   . Feeling of Stress : Not on file  Social Connections:   . Frequency of Communication with Friends and Family: Not on file  . Frequency of Social Gatherings with Friends and Family: Not on file  . Attends Religious Services: Not on file  . Active Member of Clubs or Organizations: Not on file  . Attends Banker Meetings: Not on file  . Marital Status: Not on file  Intimate Partner Violence:   . Fear of Current or Ex-Partner: Not on file  . Emotionally Abused: Not on file  . Physically Abused: Not on file  . Sexually Abused: Not on file     Physical Exam  Vital Signs and Nursing Notes reviewed Vitals:   02/27/19 1553 02/27/19 1712  BP: 135/65 (!) 141/72  Pulse: 95 64  Resp: 18 18  Temp: 98.4 F (36.9 C)   SpO2: 100% 97%    CONSTITUTIONAL: Well-appearing, NAD NEURO:  Alert and oriented x 3, no focal deficits EYES:  eyes equal and reactive ENT/NECK:  no LAD, no JVD CARDIO: Regular rate, well-perfused, normal S1 and S2 PULM:  CTAB no wheezing or rhonchi GI/GU:  normal bowel sounds, non-distended, non-tender MSK/SPINE:  No gross deformities, no  edema SKIN: 2 cm domed skin nodule to the right lower leg with mild surrounding erythematous base, central aspect of the nodule with abnormal color and texture PSYCH:  Appropriate speech and behavior  Diagnostic and Interventional Summary    EKG Interpretation  Date/Time:    Ventricular Rate:    PR Interval:    QRS Duration:   QT Interval:    QTC Calculation:   R Axis:     Text Interpretation:        Labs Reviewed - No data to display  No orders to display    Medications - No data to display   Procedures  /  Critical Care Ultrasound ED Soft Tissue  Date/Time: 02/27/2019 5:43 PM Performed by: Maudie Flakes, MD Authorized by: Maudie Flakes, MD   Procedure details:    Indications: localization of abscess and evaluate for cellulitis     Transverse view:  Visualized   Longitudinal  view:  Visualized   Images: archived   Location:    Location: lower extremity     Side:  Right Findings:     cellulitis present    ED Course and Medical Decision Making  I have reviewed the triage vital signs, the nursing notes, and pertinent available records from the EMR.  Pertinent labs & imaging results that were available during my care of the patient were reviewed by me and considered in my medical decision making (see below for details).     Atypical skin nodule not consistent with standard abscess.  Central crusting that is not expressing fluid.  Favoring basal or squamous cell carcinoma with possibly some surrounding inflammation or cellulitis.  Mild cobblestoning seen on bedside ultrasound, hypoechoic region seem to have some vascularity.  Given these concerns, incision and drainage not indicated, will cover with Keflex but advised close dermatology follow-up.    Barth Kirks. [place] Small, [place] [email or web address]  Final Clinical Impressions(s) / ED Diagnoses     ICD-10-CM   1. Skin nodule  R22.9   2. Cellulitis, unspecified cellulitis site  L03.90     ED Discharge Orders         Ordered    cephALEXin (KEFLEX) 500 MG capsule  2 times daily     02/27/19 1739           Discharge Instructions Discussed with and Provided to Patient:     Discharge Instructions     You were evaluated in the Emergency Department and after careful evaluation, we did not find any emergent condition requiring admission or further testing in the hospital.  The skin nodule on your leg has some abnormalities and needs to be evaluated by dermatologist.  There seems to be some surrounding infection.  Please take the Keflex antibiotic as directed.  Use Tylenol at home for pain.  We recommend continued warm compresses to the area.  Please return to the Emergency Department if you experience any worsening of your condition.  We encourage you to  follow up with a primary care provider.  Thank you for allowing [place] to be a part of your care.       Maudie Flakes, MD 02/27/19 [phone]

## 2019-02-27 NOTE — ED Triage Notes (Signed)
Pt c/o abscess like area to right LE x 1 week-NAD-steady gait with own cane

## 2019-02-27 NOTE — ED Notes (Signed)
ED Provider at bedside. 

## 2019-02-27 NOTE — Discharge Instructions (Signed)
You were evaluated in the Emergency Department and after careful evaluation, we did not find any emergent condition requiring admission or further testing in the hospital.  The skin nodule on your leg has some abnormalities and needs to be evaluated by dermatologist.  There seems to be some surrounding infection.  Please take the Keflex antibiotic as directed.  Use Tylenol at home for pain.  We recommend continued warm compresses to the area.  Please return to the Emergency Department if you experience any worsening of your condition.  We encourage you to follow up with a primary care provider.  Thank you for allowing [place] to be a part of your care.

## 2019-03-01 ENCOUNTER — Other Ambulatory Visit (INDEPENDENT_AMBULATORY_CARE_PROVIDER_SITE_OTHER): Payer: Medicare Other

## 2019-03-01 ENCOUNTER — Telehealth: Payer: Self-pay | Admitting: Gastroenterology

## 2019-03-01 DIAGNOSIS — R1010 Upper abdominal pain, unspecified: Secondary | ICD-10-CM

## 2019-03-01 DIAGNOSIS — K5900 Constipation, unspecified: Secondary | ICD-10-CM | POA: Diagnosis not present

## 2019-03-01 DIAGNOSIS — K56699 Other intestinal obstruction unspecified as to partial versus complete obstruction: Secondary | ICD-10-CM

## 2019-03-01 LAB — BASIC METABOLIC PANEL
BUN: 25 mg/dL — ABNORMAL HIGH (ref 6–23)
CO2: 28 mEq/L (ref 19–32)
Calcium: 9 mg/dL (ref 8.4–10.5)
Chloride: 103 mEq/L (ref 96–112)
Creatinine, Ser: 1.34 mg/dL — ABNORMAL HIGH (ref 0.40–1.20)
GFR: 36.56 mL/min — ABNORMAL LOW (ref >60.00)
Glucose, Bld: 127 mg/dL — ABNORMAL HIGH (ref 70–99)
Potassium: 4 mEq/L (ref 3.5–5.1)
Sodium: 137 mEq/L (ref 135–145)

## 2019-03-01 LAB — C-REACTIVE PROTEIN: CRP: 3.6 mg/dL (ref 0.5–20.0)

## 2019-03-01 LAB — CBC WITH DIFFERENTIAL/PLATELET
Basophils Absolute: 0.2 10*3/uL — ABNORMAL HIGH (ref 0.0–0.1)
Basophils Relative: 0.9 % (ref 0.0–3.0)
Eosinophils Absolute: 1.2 10*3/uL — ABNORMAL HIGH (ref 0.0–0.7)
Eosinophils Relative: 6.2 % — ABNORMAL HIGH (ref 0.0–5.0)
HCT: 39.4 % (ref 36.0–46.0)
Hemoglobin: 13 g/dL (ref 12.0–15.0)
Lymphocytes Relative: 6.3 % — ABNORMAL LOW (ref 12.0–46.0)
Lymphs Abs: 1.3 10*3/uL (ref 0.7–4.0)
MCHC: 33 g/dL (ref 30.0–36.0)
MCV: 89.5 fl (ref 78.0–100.0)
Monocytes Absolute: 1.1 10*3/uL — ABNORMAL HIGH (ref 0.1–1.0)
Monocytes Relative: 5.5 % (ref 3.0–12.0)
Neutro Abs: 16 10*3/uL — ABNORMAL HIGH (ref 1.4–7.7)
Neutrophils Relative %: 81.1 % — ABNORMAL HIGH (ref 43.0–77.0)
Platelets: 289 10*3/uL (ref 150.0–400.0)
RBC: 4.41 Mil/uL (ref 3.87–5.11)
RDW: 14 % (ref 11.5–15.5)
WBC: 19.8 10*3/uL (ref 4.0–10.5)

## 2019-03-01 NOTE — Telephone Encounter (Signed)
Received critical lab results- WBC 19.8 Spoke with patient's son (DPOA), Joe, at length to review results.  She is otherwise feeling well from a GI standpoint.  Eating a sandwich currently, and previous abdominal pain has since resolved.  No diarrhea.  She was actually seen in the ER on 02/27/2019 with RLE skin nodule, diagnosed with cellulitis with concern for possible BCC or SCC.  I&D not performed due to central crusting and not expressing fluid, with cobblestoning on bedside ultrasound.  Was prescribed Keflex and referred to dermatology.  Has appoint with dermatology on 03/21/2019.  In light of improved/resolved GI symptoms but active likely cellulitis, this certainly seems the most likely etiology for leukocytosis with left shift.  To complete antibiotics as prescribed along with Derm follow-up as scheduled.  Planning on repeat CT on 03/20/2019, but they may delay till after the dermatology appointment.  We will remain available.  All questions answered and he was very appreciative for the phone call.

## 2019-03-20 ENCOUNTER — Other Ambulatory Visit: Payer: Self-pay

## 2019-03-20 ENCOUNTER — Ambulatory Visit (HOSPITAL_BASED_OUTPATIENT_CLINIC_OR_DEPARTMENT_OTHER)
Admission: RE | Admit: 2019-03-20 | Discharge: 2019-03-20 | Disposition: A | Discharge status: Home / Self Care | Payer: Medicare Other | Source: Ambulatory Visit | Attending: Nurse Practitioner | Admitting: Nurse Practitioner

## 2019-03-20 ENCOUNTER — Telehealth: Payer: Self-pay | Admitting: Nurse Practitioner

## 2019-03-20 DIAGNOSIS — K5900 Constipation, unspecified: Secondary | ICD-10-CM | POA: Diagnosis present

## 2019-03-20 DIAGNOSIS — R1010 Upper abdominal pain, unspecified: Secondary | ICD-10-CM | POA: Diagnosis present

## 2019-03-20 DIAGNOSIS — K56699 Other intestinal obstruction unspecified as to partial versus complete obstruction: Secondary | ICD-10-CM | POA: Insufficient documentation

## 2019-03-20 MED ORDER — IOHEXOL 300 MG/ML  SOLN
100.0000 mL | Freq: Once | INTRAMUSCULAR | Status: AC | PRN
  Administered 2019-03-20: 100 mL via INTRAVENOUS

## 2019-03-20 NOTE — Telephone Encounter (Signed)
Please review CT results and advise

## 2019-03-20 NOTE — Telephone Encounter (Signed)
Pt's son Gabriel Rung reported that pt has gotten CT done today.  He requested a call back to him once results are in.

## 2019-03-21 NOTE — Telephone Encounter (Signed)
Called and spoke with patient's son-Joe-verified DPR-Joe informed of result note and MD recommendations; Gabriel Rung is agreeable with plan of care; Joe verbalized understanding of information/instructions;  Gabriel Rung was advised to call the office at [phone] if questions/concerns arise;  Joe reports the patient is still having a "sensation" in her stomach-feeling better-she is still taking Miralax as needed; Joe reports he will call back to the office should the patient's symptoms return;

## 2019-03-21 NOTE — Telephone Encounter (Signed)
I reviewed the CT results. The previously noted narrowing and possible stricture in the transverse colon was no longer present on this study. No obstruction noted. No inflammation noted. There was sigmoid diverticulosis, but no diverticulitis. Given the resolution of the previously noted findings on the prior CT from 01/2019, no plan for colonoscopy at this juncture. Can f/u as needed.

## 2019-03-21 NOTE — Telephone Encounter (Signed)
Noted  

## 2021-05-06 IMAGING — CT CT ABD-PELV W/ CM
2 of 5 series · 15 of 46 positions shown, 17 images · IV contrast (Omnipaque)
Comparison: None.

CLINICAL DATA: Left upper quadrant abdominal pain. Concern for
diverticulitis or pancreatitis.

EXAM:
CT ABDOMEN AND PELVIS WITH CONTRAST
TECHNIQUE: Multidetector CT imaging of the abdomen and pelvis was performed
using the standard protocol following bolus administration of
intravenous contrast.
CONTRAST:  100mL OMNIPAQUE IOHEXOL 300 MG/ML  SOLN

[Series 2: axial st · axial · 0.76mm/px · z∈[-571,-241]mm · 12 of 76 slices shown, 14 images]
[im 5/76  soft-tissue]
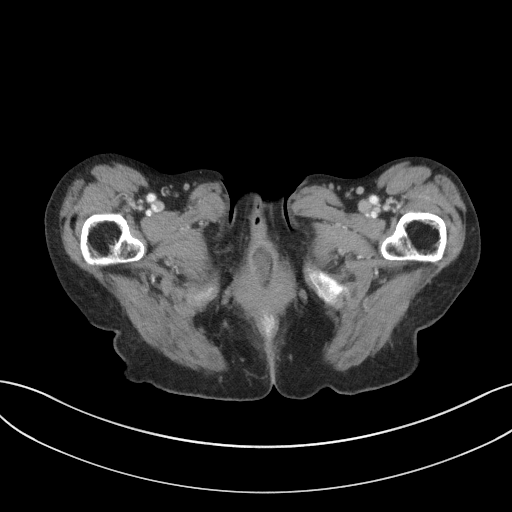
[im 5/76  bone]
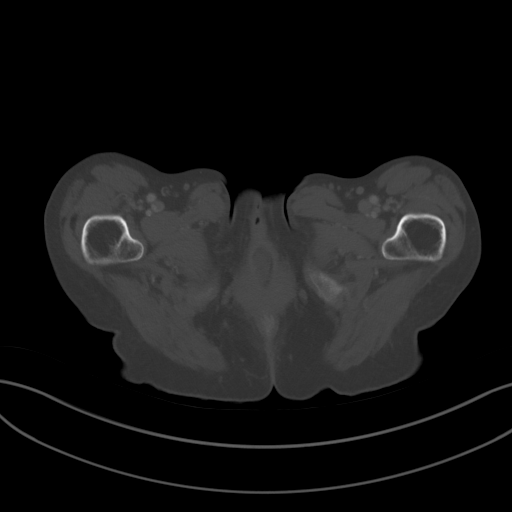
[im 13/76  soft-tissue]
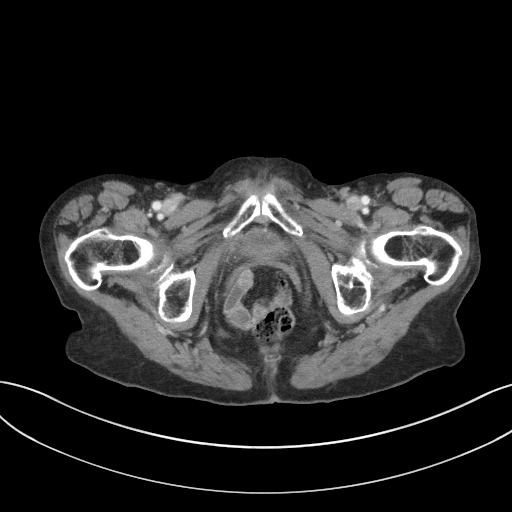
[im 17/76  soft-tissue]
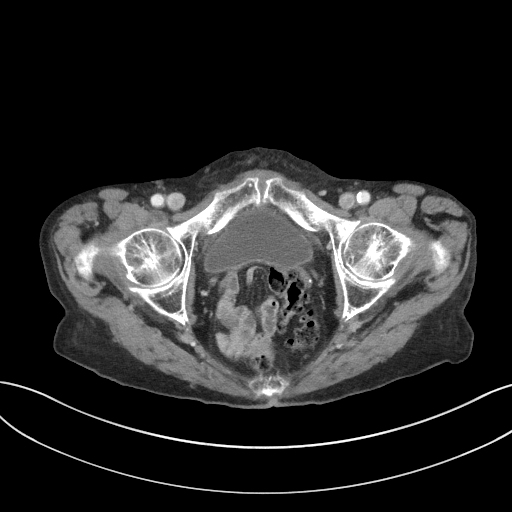
[im 21/76  soft-tissue]
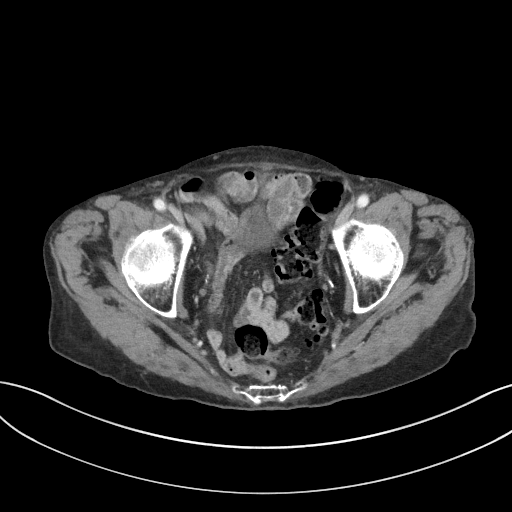
[im 30/76  soft-tissue]
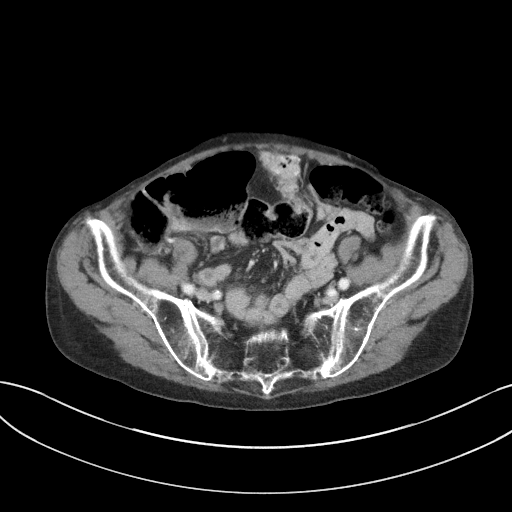
[im 34/76  soft-tissue]
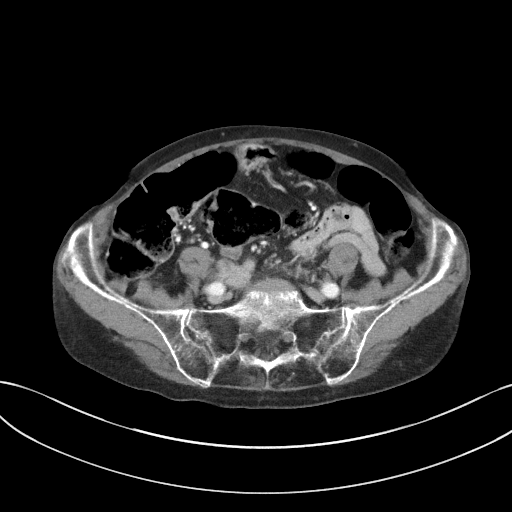
[im 42/76  soft-tissue]
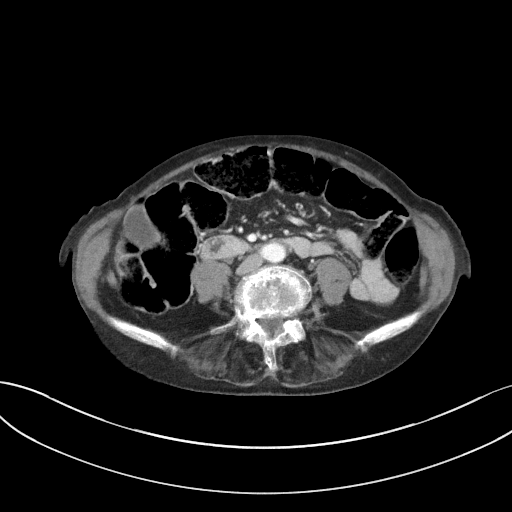
[im 46/76  soft-tissue]
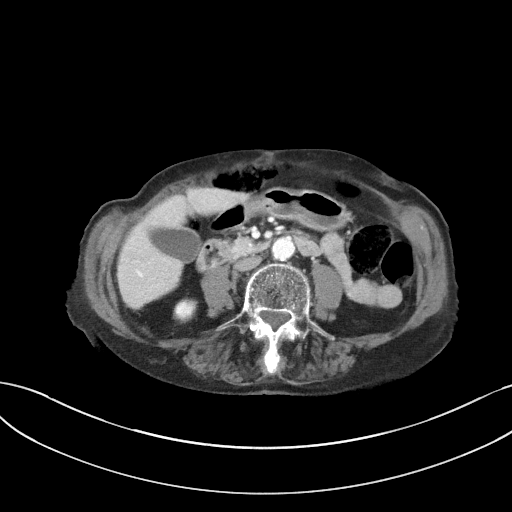
[im 55/76  soft-tissue]
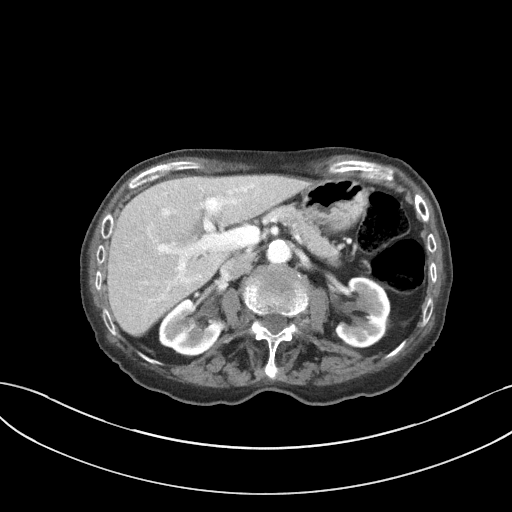
[im 55/76  bone]
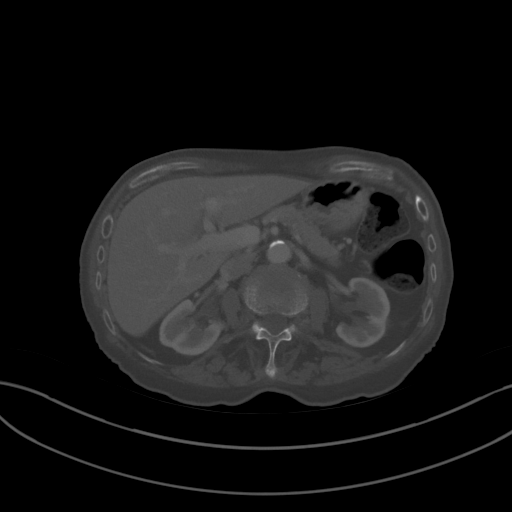
[im 59/76  soft-tissue]
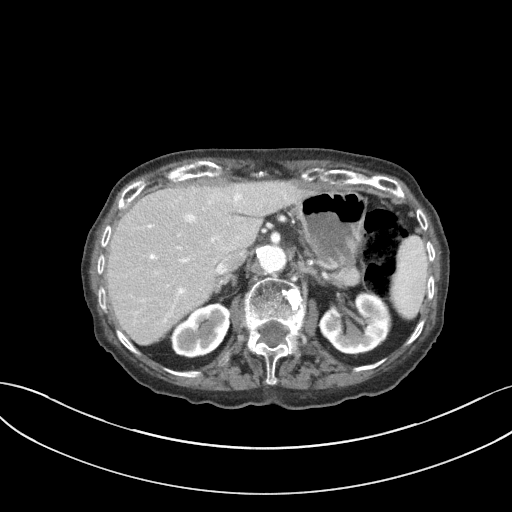
[im 63/76  soft-tissue]
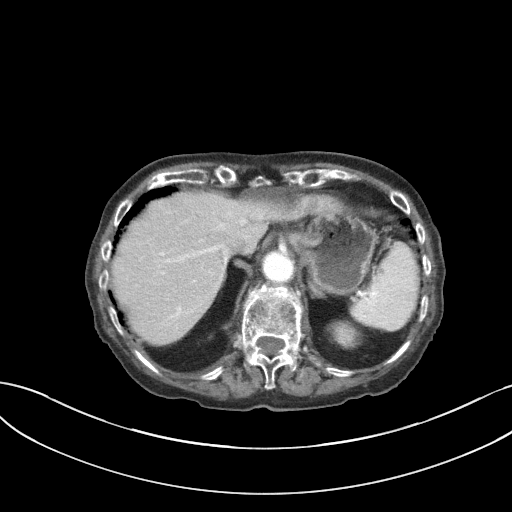
[im 71/76  soft-tissue]
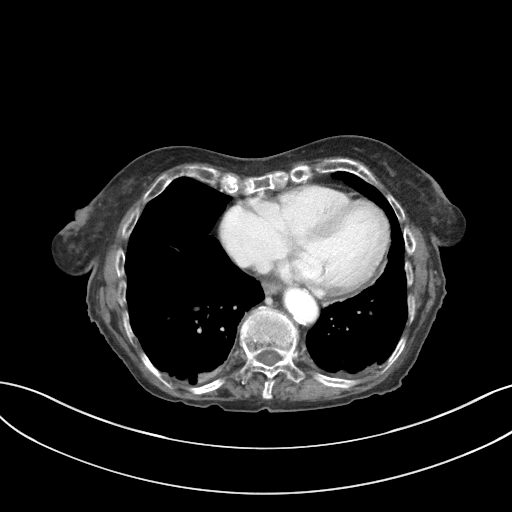

[Series 4: coronal st · coronal · 0.64mm/px · 3 of 71 slices shown]
[im 24/71  soft-tissue]
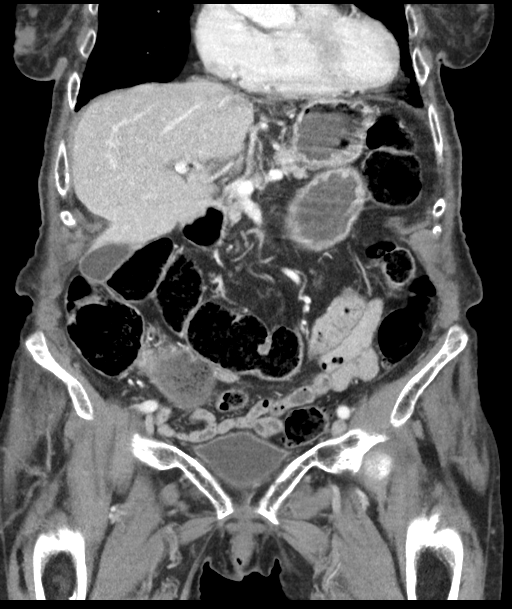
[im 32/71  soft-tissue]
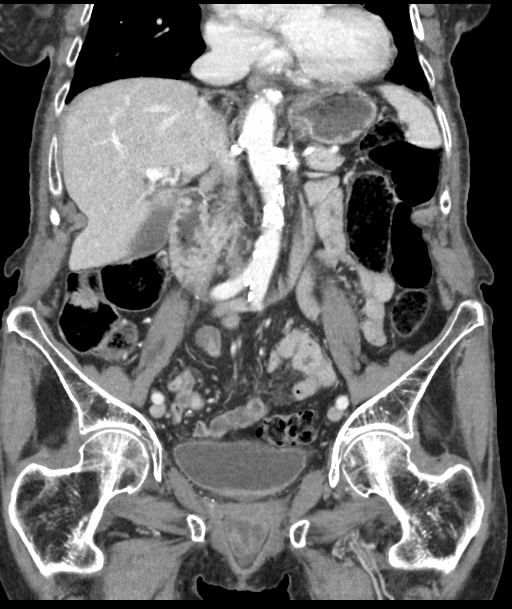
[im 39/71  soft-tissue]
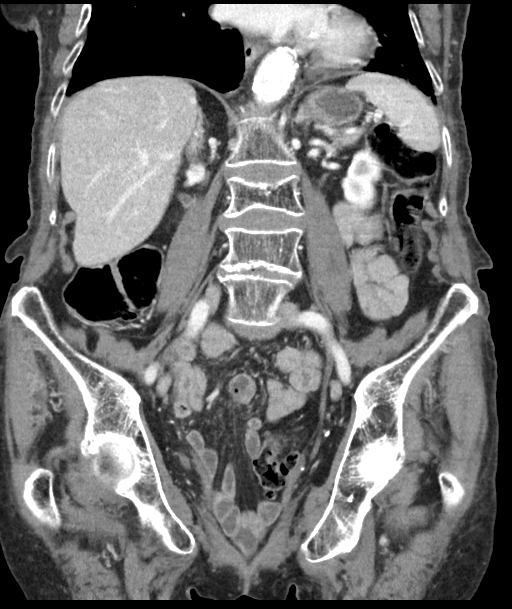

[15 of 46 positions shown; findings below may reference images not displayed]

FINDINGS: Lower chest: There is atelectasis at the lung bases.The heart is
enlarged.

Hepatobiliary: The liver is normal. Normal gallbladder.There is no
biliary ductal dilation.

Pancreas: Normal contours without ductal dilatation. No
peripancreatic fluid collection.

Spleen: No splenic laceration or hematoma.

Adrenals/Urinary Tract:

--Adrenal glands: No adrenal hemorrhage.

--Right kidney/ureter: No hydronephrosis or perinephric hematoma.

--Left kidney/ureter: No hydronephrosis or perinephric hematoma.

--Urinary bladder: Unremarkable.

Stomach/Bowel:

--Stomach/Duodenum: No hiatal hernia or other gastric abnormality.
Normal duodenal course and caliber.

--Small bowel: No dilatation or inflammation.

--Colon: There is sigmoid diverticulosis without CT evidence for
diverticulitis. There is a large amount of stool throughout the
colon. There is a focal long segment area of narrowing involving the
transverse colon (axial series 2, image 48).

--Appendix: Not visualized. No right lower quadrant inflammation or
free fluid.

Vascular/Lymphatic: Atherosclerotic calcification is present within
the non-aneurysmal abdominal aorta, without hemodynamically
significant stenosis.

--No retroperitoneal lymphadenopathy.

--No mesenteric lymphadenopathy.

--No pelvic or inguinal lymphadenopathy.

Reproductive: Status post hysterectomy. No adnexal mass. There is
significant pelvic floor laxity.

Other: No ascites or free air. The abdominal wall is normal.

Musculoskeletal. There is chronic appearing height loss of the L3
and L1 vertebral bodies. There is no significant retropulsion. There
is no obvious acute displaced fracture on this exam.
IMPRESSION: 1. No acute abdominopelvic abnormality.
2. Large amount of stool throughout the colon. There is a focal long
segment area of narrowing involving the transverse colon. While this
could be secondary to underdistention, a colonic mass is not
excluded. Correlation with colonoscopy is recommended.
3. Sigmoid diverticulosis without CT evidence for diverticulitis.
4. Large stool burden.
5. Cardiomegaly.
6. Pelvic floor laxity.
7. Chronic appearing height loss of the L3 and L1 vertebral bodies.

Aortic Atherosclerosis ([id number].D).

## 2021-06-16 IMAGING — CT CT ABD-PELV W/ CM
2 of 5 series · 16 of 46 positions shown, 18 images · IV contrast (Omnipaque)
Comparison: CT scan 02/07/2019

CLINICAL DATA: Upper abdominal pain and constipation. Re-evaluate
possible colonic stricture.

EXAM:
CT ABDOMEN AND PELVIS WITH CONTRAST
TECHNIQUE: Multidetector CT imaging of the abdomen and pelvis was performed
using the standard protocol following bolus administration of
intravenous contrast.
CONTRAST:  100mL OMNIPAQUE IOHEXOL 300 MG/ML  SOLN

[Series 2: axial st · axial · 0.68mm/px · z∈[-466,-126]mm · 13 of 76 slices shown, 15 images]
[im 4/76  soft-tissue]
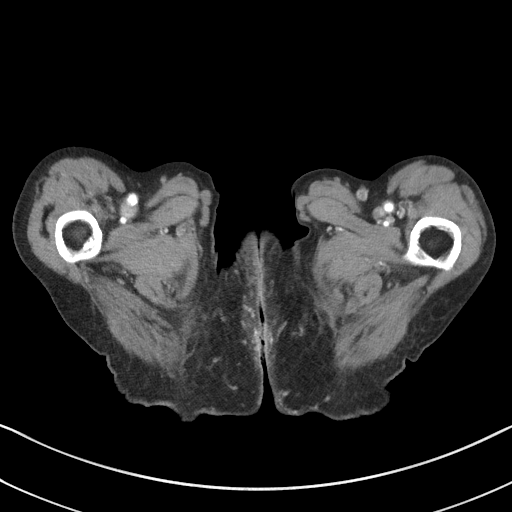
[im 4/76  bone]
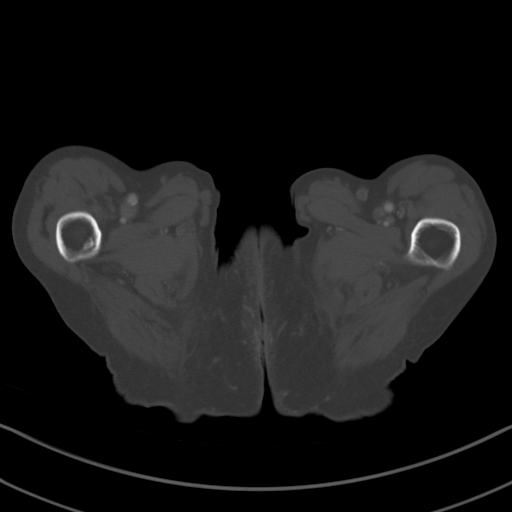
[im 12/76  soft-tissue]
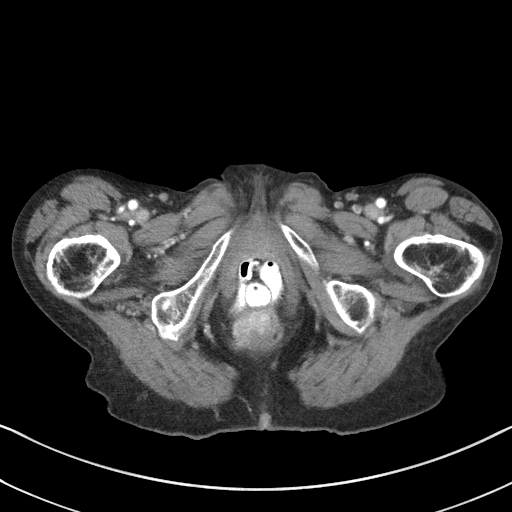
[im 16/76  soft-tissue]
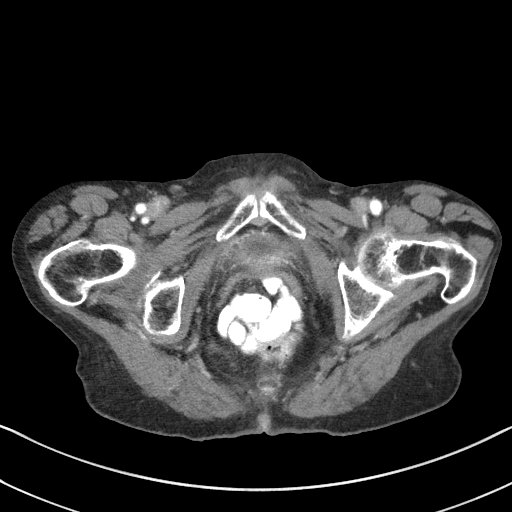
[im 20/76  soft-tissue]
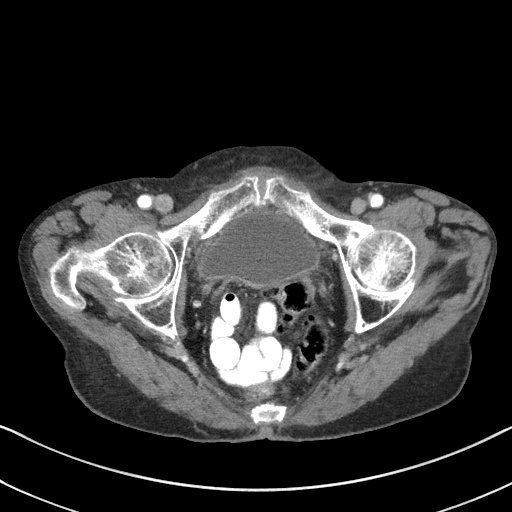
[im 28/76  soft-tissue]
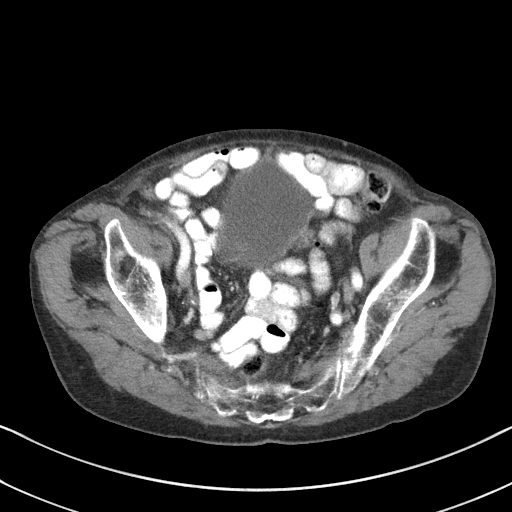
[im 32/76  soft-tissue]
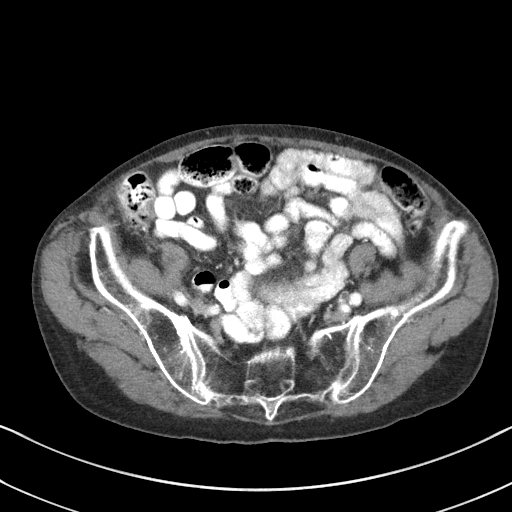
[im 40/76  soft-tissue]
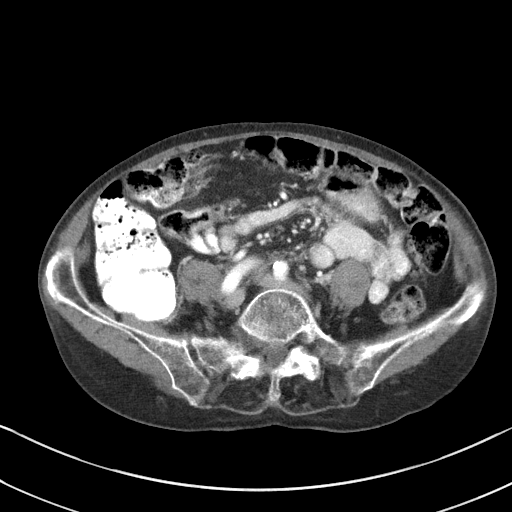
[im 44/76  soft-tissue]
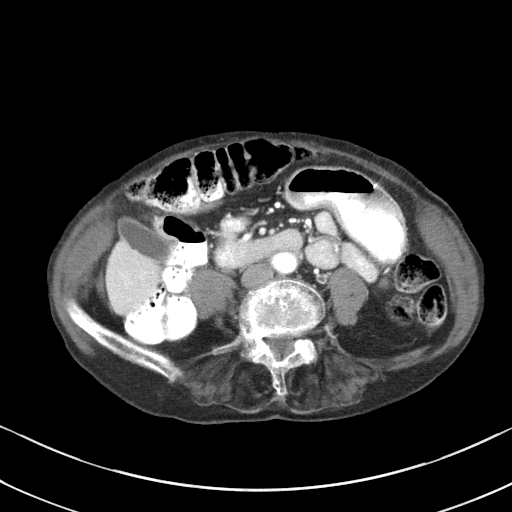
[im 48/76  soft-tissue]
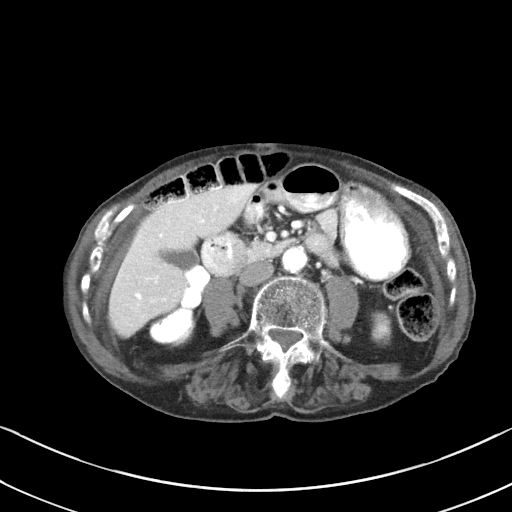
[im 48/76  bone]
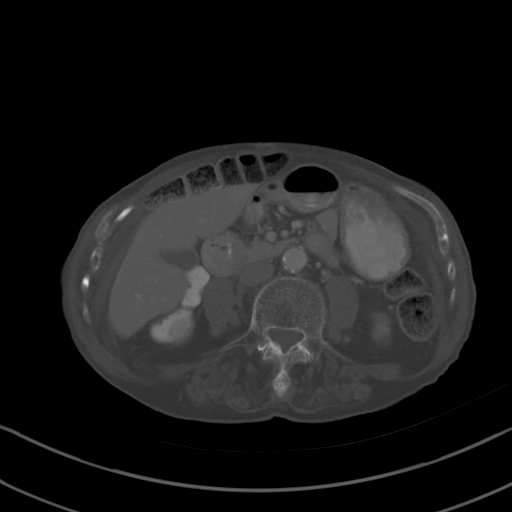
[im 56/76  soft-tissue]
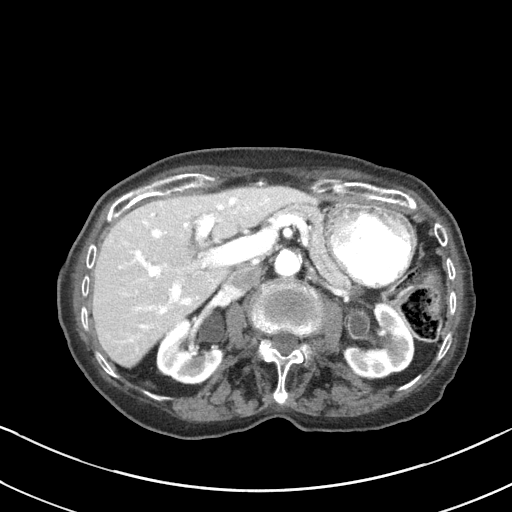
[im 60/76  soft-tissue]
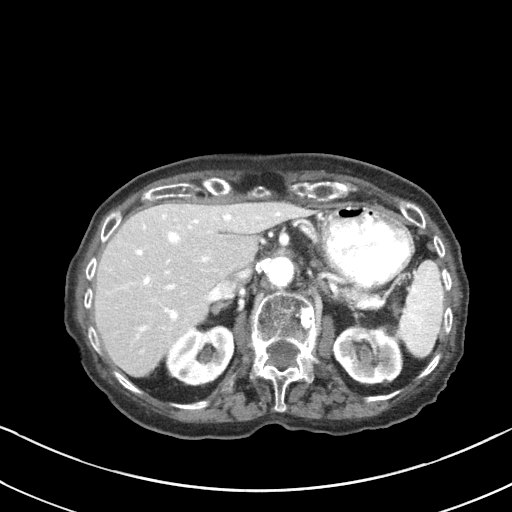
[im 64/76  soft-tissue]
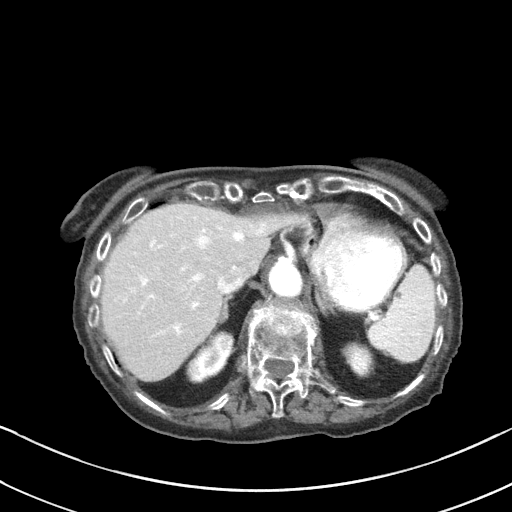
[im 72/76  soft-tissue]
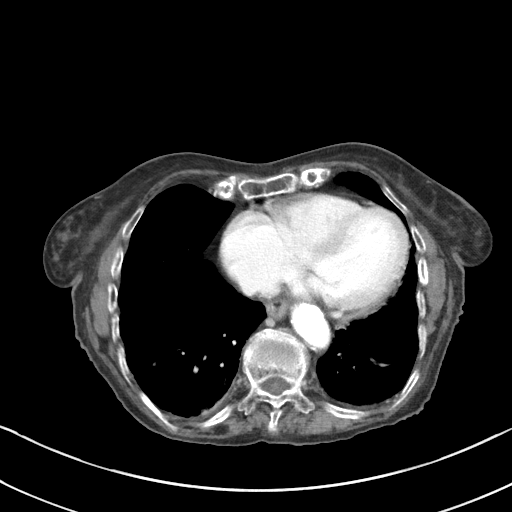

[Series 4: coronal st · coronal · 0.66mm/px · 3 of 76 slices shown]
[im 26/76  soft-tissue]
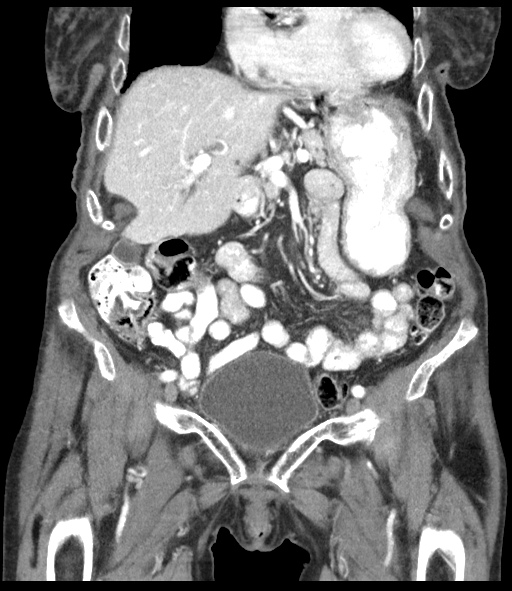
[im 34/76  soft-tissue]
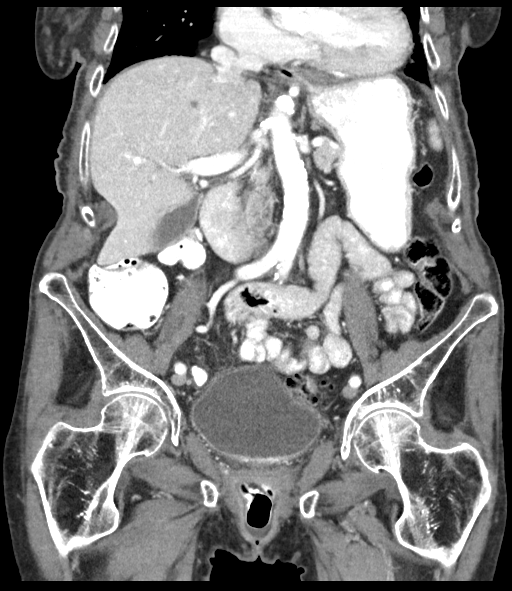
[im 42/76  soft-tissue]
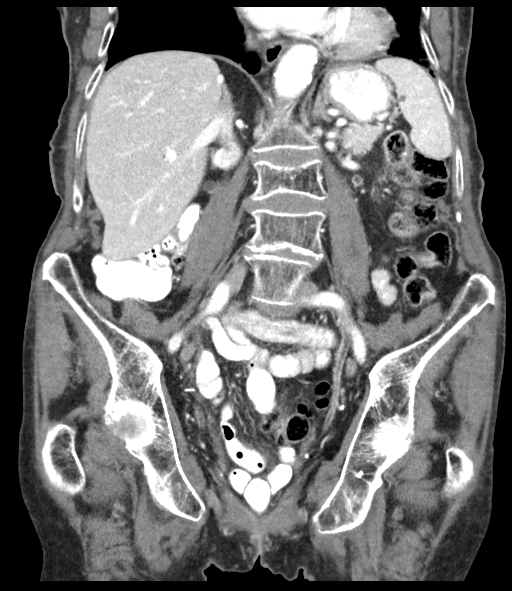

[16 of 46 positions shown; findings below may reference images not displayed]

FINDINGS: Lower chest: Mild basilar scarring and dependent atelectasis. No
infiltrates or effusions. The heart is borderline enlarged but
stable. No pericardial effusion. Stable aortic and coronary artery
calcifications. The distal esophagus is grossly normal.

Hepatobiliary: A few tiny hepatic cysts are again noted. There is
also a stable vascular shunt noted in the right hepatic lobe. No
worrisome hepatic lesions or intrahepatic biliary dilatation. The
gallbladder is normal. No common bile duct dilatation.

Pancreas: No mass, inflammation or ductal dilatation.

Spleen: Normal size. No focal lesions.

Adrenals/Urinary Tract: The adrenal glands are unremarkable and
stable.

Small bilateral renal cysts. 7 mm calcified left renal artery
aneurysm noted. No worrisome renal lesions or renal calculi. The
bladder appears normal.

Stomach/Bowel: The stomach, duodenum, small bowel and colon are
unremarkable. No acute inflammatory changes, mass lesions or
obstructive findings. The terminal ileum is normal.

Moderately redundant ascending colon but I do not see any evidence
of a stricture, mass or obstruction. There is significant sigmoid
colon diverticulosis but no findings for acute diverticulitis.

Vascular/Lymphatic: Stable age related atherosclerotic
calcifications involving the abdominal aorta, branch vessel ostia
and iliac arteries. No aneurysm or dissection. The branch vessels
are patent. The major venous structures are patent.

No mesenteric or retroperitoneal mass or adenopathy.

Reproductive: Surgically absent.

Other: No pelvic mass or adenopathy. No free pelvic fluid
collections. No inguinal mass or adenopathy. No abdominal wall
hernia or subcutaneous lesions.

Musculoskeletal: No significant bony findings. Remote L1 and L3
compression fractures are noted.
IMPRESSION: 1. No acute abdominal/pelvic findings, mass lesions or
lymphadenopathy.
2. Moderately redundant ascending colon but no findings for
stricture, mass or obstruction.
3. Sigmoid colon diverticulosis without findings for acute
diverticulitis.

Aortic Atherosclerosis (704OC-BD4.4).

## 2021-08-11 ENCOUNTER — Encounter (HOSPITAL_COMMUNITY): Payer: Self-pay | Admitting: Internal Medicine

## 2021-08-11 ENCOUNTER — Emergency Department (HOSPITAL_COMMUNITY): Payer: Medicare Other

## 2021-08-11 ENCOUNTER — Inpatient Hospital Stay (HOSPITAL_COMMUNITY): Payer: Medicare Other | Admitting: Anesthesiology

## 2021-08-11 ENCOUNTER — Other Ambulatory Visit: Payer: Self-pay

## 2021-08-11 ENCOUNTER — Inpatient Hospital Stay (HOSPITAL_COMMUNITY)
Admission: EM | Admit: 2021-08-11 | Discharge: 2021-08-15 | DRG: 481 | Disposition: A | Discharge status: Skilled Nursing Facility | Payer: Medicare Other | Source: Skilled Nursing Facility | Attending: Student | Admitting: Student

## 2021-08-11 DIAGNOSIS — N289 Disorder of kidney and ureter, unspecified: Secondary | ICD-10-CM | POA: Diagnosis not present

## 2021-08-11 DIAGNOSIS — I4510 Unspecified right bundle-branch block: Secondary | ICD-10-CM | POA: Diagnosis present

## 2021-08-11 DIAGNOSIS — Z800 Family history of malignant neoplasm of digestive organs: Secondary | ICD-10-CM | POA: Diagnosis not present

## 2021-08-11 DIAGNOSIS — R739 Hyperglycemia, unspecified: Secondary | ICD-10-CM

## 2021-08-11 DIAGNOSIS — S72009A Fracture of unspecified part of neck of unspecified femur, initial encounter for closed fracture: Secondary | ICD-10-CM | POA: Diagnosis present

## 2021-08-11 DIAGNOSIS — R4189 Other symptoms and signs involving cognitive functions and awareness: Secondary | ICD-10-CM

## 2021-08-11 DIAGNOSIS — W19XXXA Unspecified fall, initial encounter: Principal | ICD-10-CM

## 2021-08-11 DIAGNOSIS — E559 Vitamin D deficiency, unspecified: Secondary | ICD-10-CM

## 2021-08-11 DIAGNOSIS — Y92098 Other place in other non-institutional residence as the place of occurrence of the external cause: Secondary | ICD-10-CM | POA: Diagnosis not present

## 2021-08-11 DIAGNOSIS — M810 Age-related osteoporosis without current pathological fracture: Secondary | ICD-10-CM | POA: Diagnosis present

## 2021-08-11 DIAGNOSIS — Z79890 Hormone replacement therapy (postmenopausal): Secondary | ICD-10-CM | POA: Diagnosis not present

## 2021-08-11 DIAGNOSIS — Z90710 Acquired absence of both cervix and uterus: Secondary | ICD-10-CM

## 2021-08-11 DIAGNOSIS — H903 Sensorineural hearing loss, bilateral: Secondary | ICD-10-CM | POA: Diagnosis not present

## 2021-08-11 DIAGNOSIS — Z8739 Personal history of other diseases of the musculoskeletal system and connective tissue: Secondary | ICD-10-CM

## 2021-08-11 DIAGNOSIS — N179 Acute kidney failure, unspecified: Secondary | ICD-10-CM | POA: Diagnosis present

## 2021-08-11 DIAGNOSIS — D72825 Bandemia: Secondary | ICD-10-CM | POA: Diagnosis present

## 2021-08-11 DIAGNOSIS — Z880 Allergy status to penicillin: Secondary | ICD-10-CM

## 2021-08-11 DIAGNOSIS — W101XXA Fall (on)(from) sidewalk curb, initial encounter: Secondary | ICD-10-CM | POA: Diagnosis present

## 2021-08-11 DIAGNOSIS — Z66 Do not resuscitate: Secondary | ICD-10-CM | POA: Diagnosis present

## 2021-08-11 DIAGNOSIS — D62 Acute posthemorrhagic anemia: Secondary | ICD-10-CM

## 2021-08-11 DIAGNOSIS — M47812 Spondylosis without myelopathy or radiculopathy, cervical region: Secondary | ICD-10-CM | POA: Diagnosis present

## 2021-08-11 DIAGNOSIS — S72142A Displaced intertrochanteric fracture of left femur, initial encounter for closed fracture: Principal | ICD-10-CM | POA: Diagnosis present

## 2021-08-11 DIAGNOSIS — S72002A Fracture of unspecified part of neck of left femur, initial encounter for closed fracture: Secondary | ICD-10-CM

## 2021-08-11 DIAGNOSIS — I10 Essential (primary) hypertension: Secondary | ICD-10-CM | POA: Diagnosis present

## 2021-08-11 DIAGNOSIS — E039 Hypothyroidism, unspecified: Secondary | ICD-10-CM | POA: Diagnosis present

## 2021-08-11 DIAGNOSIS — D72828 Other elevated white blood cell count: Secondary | ICD-10-CM | POA: Diagnosis present

## 2021-08-11 DIAGNOSIS — S72102A Unspecified trochanteric fracture of left femur, initial encounter for closed fracture: Secondary | ICD-10-CM | POA: Diagnosis not present

## 2021-08-11 DIAGNOSIS — Y9301 Activity, walking, marching and hiking: Secondary | ICD-10-CM | POA: Diagnosis present

## 2021-08-11 DIAGNOSIS — R410 Disorientation, unspecified: Secondary | ICD-10-CM | POA: Diagnosis not present

## 2021-08-11 DIAGNOSIS — Z20822 Contact with and (suspected) exposure to covid-19: Secondary | ICD-10-CM | POA: Diagnosis present

## 2021-08-11 DIAGNOSIS — D72829 Elevated white blood cell count, unspecified: Secondary | ICD-10-CM

## 2021-08-11 DIAGNOSIS — Z79899 Other long term (current) drug therapy: Secondary | ICD-10-CM

## 2021-08-11 LAB — URINALYSIS, ROUTINE W REFLEX MICROSCOPIC
Bilirubin Urine: NEGATIVE
Glucose, UA: >=500 mg/dL — AB
Ketones, ur: NEGATIVE mg/dL
Nitrite: NEGATIVE
Protein, ur: 30 mg/dL — AB
Specific Gravity, Urine: 1.014 (ref 1.005–1.030)
pH: 5 (ref 5.0–8.0)

## 2021-08-11 LAB — CBC WITH DIFFERENTIAL/PLATELET
Abs Immature Granulocytes: 0.09 10*3/uL — ABNORMAL HIGH (ref 0.00–0.07)
Basophils Absolute: 0.1 10*3/uL (ref 0.0–0.1)
Basophils Relative: 1 %
Eosinophils Absolute: 0.3 10*3/uL (ref 0.0–0.5)
Eosinophils Relative: 2 %
HCT: 42.8 % (ref 36.0–46.0)
Hemoglobin: 14.2 g/dL (ref 12.0–15.0)
Immature Granulocytes: 1 %
Lymphocytes Relative: 9 %
Lymphs Abs: 1.3 10*3/uL (ref 0.7–4.0)
MCH: 30.7 pg (ref 26.0–34.0)
MCHC: 33.2 g/dL (ref 30.0–36.0)
MCV: 92.4 fL (ref 80.0–100.0)
Monocytes Absolute: 0.8 10*3/uL (ref 0.1–1.0)
Monocytes Relative: 5 %
Neutro Abs: 13 10*3/uL — ABNORMAL HIGH (ref 1.7–7.7)
Neutrophils Relative %: 82 %
Platelets: 275 10*3/uL (ref 150–400)
RBC: 4.63 MIL/uL (ref 3.87–5.11)
RDW: 13.5 % (ref 11.5–15.5)
WBC: 15.6 10*3/uL — ABNORMAL HIGH (ref 4.0–10.5)
nRBC: 0 % (ref 0.0–0.2)

## 2021-08-11 LAB — RESP PANEL BY RT-PCR (FLU A&B, COVID) ARPGX2
Influenza A by PCR: NEGATIVE
Influenza B by PCR: NEGATIVE
SARS Coronavirus 2 by RT PCR: NEGATIVE

## 2021-08-11 LAB — ABO/RH: ABO/RH(D): O POS

## 2021-08-11 LAB — BASIC METABOLIC PANEL
Anion gap: 10 (ref 5–15)
BUN: 32 mg/dL — ABNORMAL HIGH (ref 8–23)
CO2: 23 mmol/L (ref 22–32)
Calcium: 8.9 mg/dL (ref 8.9–10.3)
Chloride: 108 mmol/L (ref 98–111)
Creatinine, Ser: 1.11 mg/dL — ABNORMAL HIGH (ref 0.44–1.00)
GFR, Estimated: 45 mL/min — ABNORMAL LOW (ref >60)
Glucose, Bld: 268 mg/dL — ABNORMAL HIGH (ref 70–99)
Potassium: 3.7 mmol/L (ref 3.5–5.1)
Sodium: 141 mmol/L (ref 135–145)

## 2021-08-11 LAB — TYPE AND SCREEN
ABO/RH(D): O POS
Antibody Screen: NEGATIVE

## 2021-08-11 LAB — PROTIME-INR
INR: 1 (ref 0.8–1.2)
Prothrombin Time: 12.6 seconds (ref 11.4–15.2)

## 2021-08-11 LAB — VITAMIN D 25 HYDROXY (VIT D DEFICIENCY, FRACTURES): Vit D, 25-Hydroxy: 11.5 ng/mL — ABNORMAL LOW (ref 30–100)

## 2021-08-11 MED ORDER — FLEET ENEMA 7-19 GM/118ML RE ENEM: 1.0000 | ENEMA | Freq: Once | RECTAL | Status: DC | PRN

## 2021-08-11 MED ORDER — MUPIROCIN 2 % EX OINT
1.0000 | TOPICAL_OINTMENT | Freq: Two times a day (BID) | CUTANEOUS | Status: DC
  Administered 2021-08-12 – 2021-08-15 (×7): 1 via NASAL
  Filled 2021-08-11 (×2): qty 22

## 2021-08-11 MED ORDER — ROPIVACAINE HCL 5 MG/ML IJ SOLN
INTRAMUSCULAR | Status: DC | PRN
  Administered 2021-08-11: 25 mL via PERINEURAL

## 2021-08-11 MED ORDER — SODIUM CHLORIDE 0.9 % IV SOLN: INTRAVENOUS | Status: DC

## 2021-08-11 MED ORDER — HYDROCODONE-ACETAMINOPHEN 5-325 MG PO TABS
1.0000 | ORAL_TABLET | Freq: Four times a day (QID) | ORAL | Status: DC | PRN
  Administered 2021-08-12 – 2021-08-15 (×4): 1 via ORAL
  Filled 2021-08-11 (×5): qty 1

## 2021-08-11 MED ORDER — ENOXAPARIN SODIUM 40 MG/0.4ML IJ SOSY: 40.0000 mg | PREFILLED_SYRINGE | INTRAMUSCULAR | Status: DC

## 2021-08-11 MED ORDER — FENTANYL CITRATE PF 50 MCG/ML IJ SOSY
50.0000 ug | PREFILLED_SYRINGE | INTRAMUSCULAR | Status: DC
  Administered 2021-08-11: 25 ug via INTRAVENOUS
  Filled 2021-08-11: qty 2

## 2021-08-11 MED ORDER — FERROUS SULFATE 325 (65 FE) MG PO TABS
325.0000 mg | ORAL_TABLET | Freq: Three times a day (TID) | ORAL | Status: DC
  Administered 2021-08-12 – 2021-08-15 (×7): 325 mg via ORAL
  Filled 2021-08-11 (×7): qty 1

## 2021-08-11 MED ORDER — BISACODYL 5 MG PO TBEC: 5.0000 mg | DELAYED_RELEASE_TABLET | Freq: Every day | ORAL | Status: DC | PRN

## 2021-08-11 MED ORDER — MORPHINE SULFATE (PF) 2 MG/ML IV SOLN: 0.5000 mg | INTRAVENOUS | Status: DC | PRN

## 2021-08-11 MED ORDER — HYDROCODONE-ACETAMINOPHEN 5-325 MG PO TABS
1.0000 | ORAL_TABLET | Freq: Once | ORAL | Status: AC
  Administered 2021-08-11: 1 via ORAL
  Filled 2021-08-11: qty 1

## 2021-08-11 MED ORDER — DEXAMETHASONE SODIUM PHOSPHATE 4 MG/ML IJ SOLN
INTRAMUSCULAR | Status: DC | PRN
  Administered 2021-08-11: 10 mg

## 2021-08-11 MED ORDER — POLYETHYLENE GLYCOL 3350 17 G PO PACK: 17.0000 g | PACK | Freq: Every day | ORAL | Status: DC | PRN

## 2021-08-11 MED ORDER — LEVOTHYROXINE SODIUM 88 MCG PO TABS
88.0000 ug | ORAL_TABLET | Freq: Every day | ORAL | Status: DC
  Administered 2021-08-12 – 2021-08-15 (×4): 88 ug via ORAL
  Filled 2021-08-11 (×4): qty 1

## 2021-08-11 MED ORDER — METHOCARBAMOL 1000 MG/10ML IJ SOLN: 500.0000 mg | Freq: Four times a day (QID) | INTRAVENOUS | Status: DC | PRN

## 2021-08-11 MED ORDER — ENOXAPARIN SODIUM 30 MG/0.3ML IJ SOSY: 30.0000 mg | PREFILLED_SYRINGE | INTRAMUSCULAR | Status: DC

## 2021-08-11 MED ORDER — DOCUSATE SODIUM 100 MG PO CAPS
100.0000 mg | ORAL_CAPSULE | Freq: Two times a day (BID) | ORAL | Status: DC
  Administered 2021-08-11 – 2021-08-12 (×3): 100 mg via ORAL
  Filled 2021-08-11 (×3): qty 1

## 2021-08-11 MED ORDER — METHOCARBAMOL 500 MG PO TABS
500.0000 mg | ORAL_TABLET | Freq: Four times a day (QID) | ORAL | Status: DC | PRN
  Filled 2021-08-11: qty 1

## 2021-08-11 NOTE — ED Provider Notes (Signed)
[facility]-EMERGENCY DEPT Provider Note   CSN: [id number] Arrival date & time: 08/11/21  1355     History  Chief Complaint  Patient presents with   Marletta Lor    Jennifer Gallegos is a 88 y.o. female.   Fall    88 year old female who presents to the emergency department with left hip pain after a mechanical fall.  The patient was attempting to ambulate when she suffered a fall onto a sidewalk.  No head trauma or loss of consciousness.  The patient has pain with any attempts at movement of the left leg.  It is shortened and externally rotated.  She denies any other injuries or complaints.  She had a good pulse and sensation was intact with EMS.  Had tried to bear some weight and was able to partially bear weight with EMS apparently.  She arrived GCS 15, ABC intact  Home Medications Prior to Admission medications   Medication Sig Start Date End Date Taking? Authorizing Provider  amLODipine-benazepril (LOTREL) 5-10 MG capsule Take 1 capsule by mouth daily.    [provider]  levothyroxine (SYNTHROID, LEVOTHROID) 88 MCG tablet Take 88 mcg by mouth daily before breakfast.    [provider]      Allergies    Penicillins, Macrobid [nitrofurantoin], and Sulfa antibiotics    Review of Systems   Review of Systems  Musculoskeletal:  Positive for arthralgias.  All other systems reviewed and are negative.   Physical Exam Updated Vital Signs BP (!) 159/77   Pulse 86   Temp 98.2 F (36.8 C) (Oral)   Resp (!) 25   SpO2 92%  Physical Exam Vitals and nursing note reviewed.  Constitutional:      General: She is not in acute distress.    Appearance: She is well-developed.     Comments: GCS 15, ABC intact  HENT:     Head: Normocephalic and atraumatic.  Eyes:     Extraocular Movements: Extraocular movements intact.     Conjunctiva/sclera: Conjunctivae normal.     Pupils: Pupils are equal, round, and reactive to light.  Neck:     Comments: No midline  tenderness to palpation of the cervical spine.  Range of motion intact Cardiovascular:     Rate and Rhythm: Normal rate and regular rhythm.  Pulmonary:     Effort: Pulmonary effort is normal. No respiratory distress.     Breath sounds: Normal breath sounds.  Chest:     Comments: Clavicles stable nontender to AP compression.  Chest wall stable and nontender to AP and lateral compression. Abdominal:     Palpations: Abdomen is soft.     Tenderness: There is no abdominal tenderness.  Musculoskeletal:        General: Swelling, tenderness, deformity and signs of injury present.     Cervical back: Neck supple.     Comments: No midline tenderness to palpation of the thoracic or lumbar spine.  Left hip shortened and externally rotated, 2+ DP pulses, remainder of extremities atraumatic  Skin:    General: Skin is warm and dry.  Neurological:     Mental Status: She is alert.     Comments: Cranial nerves II through XII grossly intact.  Moving all 4 extremities spontaneously.  Sensation grossly intact all 4 extremities     ED Results / Procedures / Treatments   Labs (all labs ordered are listed, but only abnormal results are displayed) Labs Reviewed  RESP PANEL BY RT-PCR (FLU A&B, COVID) [id number]  CBC WITH DIFFERENTIAL/PLATELET  BASIC METABOLIC PANEL  URINALYSIS, ROUTINE W REFLEX MICROSCOPIC  PROTIME-INR    EKG None  Radiology CT HEAD WO CONTRAST  Result Date: 08/11/2021 CLINICAL DATA:  Head trauma, moderate-severe; Polytrauma, blunt EXAM: CT HEAD WITHOUT CONTRAST CT CERVICAL SPINE WITHOUT CONTRAST TECHNIQUE: Multidetector CT imaging of the head and cervical spine was performed following the standard protocol without intravenous contrast. Multiplanar CT image reconstructions of the cervical spine were also generated. RADIATION DOSE REDUCTION: This exam was performed according to the departmental dose-optimization program which includes automated exposure control, adjustment of the mA and/or kV  according to patient size and/or use of iterative reconstruction technique. COMPARISON:  01/30/2013, 12/06/2012 FINDINGS: CT HEAD FINDINGS Brain: No evidence of acute infarction, hemorrhage, hydrocephalus, extra-axial collection or mass lesion/mass effect. Extensive low-density changes within the periventricular and subcortical white matter compatible with chronic microvascular ischemic change. Moderate diffuse cerebral volume loss. Vascular: Atherosclerotic calcifications involving the large vessels of the skull base. No unexpected hyperdense vessel. Skull: Normal. Negative for fracture or focal lesion. Sinuses/Orbits: No acute finding. Other: Negative for scalp hematoma. CT CERVICAL SPINE FINDINGS Alignment: Facet joints are aligned without dislocation or traumatic listhesis. Dens and lateral masses are aligned. Skull base and vertebrae: No acute fracture. No primary bone lesion or focal pathologic process. Osseous structures are diffusely demineralized. Soft tissues and spinal canal: No prevertebral fluid or swelling. No visible canal hematoma. Disc levels:  Mild multilevel cervical spondylosis. Upper chest: Included lung apices are clear. Other: Aortic and bilateral carotid atherosclerosis. IMPRESSION: 1. No acute intracranial abnormality. 2. Chronic microvascular ischemic change and moderate diffuse cerebral volume loss. 3. No acute cervical spine fracture or subluxation. 4. Mild multilevel cervical spondylosis. Aortic Atherosclerosis (ICD10-I70.0). Electronically Signed   By: Duanne Guess D.O.   On: 08/11/2021 16:12   CT CERVICAL SPINE WO CONTRAST  Result Date: 08/11/2021 CLINICAL DATA:  Head trauma, moderate-severe; Polytrauma, blunt EXAM: CT HEAD WITHOUT CONTRAST CT CERVICAL SPINE WITHOUT CONTRAST TECHNIQUE: Multidetector CT imaging of the head and cervical spine was performed following the standard protocol without intravenous contrast. Multiplanar CT image reconstructions of the cervical spine were  also generated. RADIATION DOSE REDUCTION: This exam was performed according to the departmental dose-optimization program which includes automated exposure control, adjustment of the mA and/or kV according to patient size and/or use of iterative reconstruction technique. COMPARISON:  01/30/2013, 12/06/2012 FINDINGS: CT HEAD FINDINGS Brain: No evidence of acute infarction, hemorrhage, hydrocephalus, extra-axial collection or mass lesion/mass effect. Extensive low-density changes within the periventricular and subcortical white matter compatible with chronic microvascular ischemic change. Moderate diffuse cerebral volume loss. Vascular: Atherosclerotic calcifications involving the large vessels of the skull base. No unexpected hyperdense vessel. Skull: Normal. Negative for fracture or focal lesion. Sinuses/Orbits: No acute finding. Other: Negative for scalp hematoma. CT CERVICAL SPINE FINDINGS Alignment: Facet joints are aligned without dislocation or traumatic listhesis. Dens and lateral masses are aligned. Skull base and vertebrae: No acute fracture. No primary bone lesion or focal pathologic process. Osseous structures are diffusely demineralized. Soft tissues and spinal canal: No prevertebral fluid or swelling. No visible canal hematoma. Disc levels:  Mild multilevel cervical spondylosis. Upper chest: Included lung apices are clear. Other: Aortic and bilateral carotid atherosclerosis. IMPRESSION: 1. No acute intracranial abnormality. 2. Chronic microvascular ischemic change and moderate diffuse cerebral volume loss. 3. No acute cervical spine fracture or subluxation. 4. Mild multilevel cervical spondylosis. Aortic Atherosclerosis (ICD10-I70.0). Electronically Signed   By: Duanne Guess D.O.   On: 08/11/2021 16:12  DG Knee Left Port  Result Date: 08/11/2021 CLINICAL DATA:  Fall. EXAM: PORTABLE LEFT KNEE - 1-2 VIEW COMPARISON:  Left knee x-rays dated September 19, 2015. FINDINGS: No acute fracture or  dislocation. No joint effusion. Mild tricompartmental degenerative changes. Osteopenia. Soft tissues are unremarkable. IMPRESSION: 1. No acute osseous abnormality. 2. Mild tricompartmental osteoarthritis. Electronically Signed   By: Obie Dredge M.D.   On: 08/11/2021 15:39   DG Chest Port 1 View  Result Date: 08/11/2021 CLINICAL DATA:  Trauma. EXAM: PORTABLE CHEST 1 VIEW COMPARISON:  Chest radiograph 02/26/2013. FINDINGS: Streaky left basilar opacities. No visible pneumothorax or pleural effusions. Biapical pleuroparenchymal scarring. Mild enlargement the cardiac silhouette. No displaced fracture identified. Polyarticular degenerative change. IMPRESSION: Streaky left basilar opacities, which could represent atelectasis (favored), aspiration, and/or pneumonia. Electronically Signed   By: Feliberto Harts M.D.   On: 08/11/2021 15:34   DG Hip Unilat With Pelvis 2-3 Views Left  Result Date: 08/11/2021 CLINICAL DATA:  Fall EXAM: DG HIP (WITH OR WITHOUT PELVIS) 2-3V LEFT COMPARISON:  None Available. FINDINGS: Significantly displaced fracture of the LEFT femoral neck, intertrochanteric region, with at least some degree of impaction at the fracture site and with nearly 90% angulation deformity at the fracture site. Femoral head remains grossly well positioned relative to the acetabulum. Probable old healed fracture of the LEFT symphysis pubis. IMPRESSION: LEFT femoral neck fracture, as detailed above. Electronically Signed   By: Bary Richard M.D.   On: 08/11/2021 15:32    Procedures Procedures    Medications Ordered in ED Medications  HYDROcodone-acetaminophen (NORCO/VICODIN) 5-325 MG per tablet 1 tablet (1 tablet Oral Given 08/11/21 1556)    ED Course/ Medical Decision Making/ A&P                           Medical Decision Making Amount and/or Complexity of Data Reviewed Labs: ordered. Radiology: ordered.  Risk Prescription drug management. Decision regarding hospitalization.   88 year old  female who presents to the emergency department with left hip pain after a mechanical fall.  The patient was attempting to ambulate when she suffered a fall onto a sidewalk.  No head trauma or loss of consciousness.  The patient has pain with any attempts at movement of the left leg.  It is shortened and externally rotated.  She denies any other injuries or complaints.  She had a good pulse and sensation was intact with EMS.  Had tried to bear some weight and was able to partially bear weight with EMS apparently.  She arrived GCS 15, ABC intact  On arrival, the patient was vitally stable.  NSR noted on cardiac telemetry.  Patient physical exam concerning for a possible femur fracture with the left hip that was shortened and externally rotated on exam.  Neurovascularly otherwise intact.  No other signs of traumatic injury.  Will obtain imaging and further evaluate.  Trauma imaging concerning for CT head with no acute intracranial abnormality, CT cervical spine without fracture or malalignment of the cervical spine.  X-ray imaging of the left hip and femur was concerning for a left femoral neck fracture.  Spoke with Dr. Victorino Dike who will see the patient in consultation, recommend admission for pain control, likely fascia iliacus nerve block, surgical management.  Signout given to Dr. Silverio Lay to admit the patient pending labs.  Patient will need admission to the hospitalist service for pain control and likely surgical management with orthopedics.  Signout given at 1630.   Final  Clinical Impression(s) / ED Diagnoses Final diagnoses:  Fall, initial encounter  Closed fracture of neck of left femur, initial encounter [facility] [place])    Rx / DC Orders ED Discharge Orders     None         Ernie Avena, MD 08/11/21 1658

## 2021-08-11 NOTE — ED Triage Notes (Signed)
Pt BIBA from [facility] for mechanial fall off sidewalk onto L hip. Denies head trauma, no blood thinners. Pain with movement, no crepitus. Possible shortening and outward rotation. Good pulse and sensation. Able to bear some weight. Alert to baseline  136/64 HR 82 20 RR 95% RA CBG 208

## 2021-08-11 NOTE — ED Notes (Signed)
Patient is not located in the room for vitals.

## 2021-08-11 NOTE — Anesthesia Procedure Notes (Signed)
Anesthesia Regional Block: Peng block   Pre-Anesthetic Checklist: , timeout performed,  Correct Patient, Correct Site, Correct Laterality,  Correct Procedure, Correct Position, site marked,  Risks and benefits discussed,  Surgical consent,  Pre-op evaluation,  At surgeon's request and post-op pain management  Laterality: Left  Prep: Maximum Sterile Barrier Precautions used, chloraprep       Needles:  Injection technique: Single-shot  Needle Type: Echogenic Stimulator Needle     Needle Length: 9cm  Needle Gauge: 22     Additional Needles:   Procedures:,,,, ultrasound used (permanent image in chart),,    Narrative:  Start time: 08/11/2021 6:20 PM End time: 08/11/2021 6:30 PM Injection made incrementally with aspirations every 5 mL.  Performed by: Personally  Anesthesiologist: Lannie Fields, DO  Additional Notes: Monitors applied. No increased pain on injection. No increased resistance to injection. Injection made in 5cc increments. Good needle visualization. Patient tolerated procedure well.

## 2021-08-11 NOTE — Addendum Note (Signed)
Addendum  created 08/11/21 1840 by Lannie Fields, DO   Child order released for a procedure order, Clinical Note Signed, Intraprocedure Blocks edited, SmartForm saved

## 2021-08-11 NOTE — H&P (View-Only) (Signed)
Reason for Consult:left hip pain Referring Physician: Dr. Andreas Newport is an 88 y.o. female.  HPI: 88 y/o female without significant PMH injured her left hip when she fell this morning.  She reports that she got up from a seated position and was too close to a curb and caught her foot on it as she stepped.  She fell on her buttocks and had immediate pain in the left groin.  She was unable to bear weight and was brought to the ED for eval.  She has a h/o sacral insufficiency fracture treated at [facility].  She c/o sharp pain in the groin with any motion.  Pain is better after anesthesia fascia iliaca block.  She is not a smoker and is not diabetic.  She denies any h/o surgery to the left hip.  Past Medical History:  Diagnosis Date   Hypertension    Thyroid disease     Past Surgical History:  Procedure Laterality Date   ABDOMINAL HYSTERECTOMY     APPENDECTOMY     TONSILLECTOMY      History reviewed. No pertinent family history.  Social History:  reports that she has never smoked. She has never used smokeless tobacco. She reports that she does not drink alcohol and does not use drugs.  Allergies:  Allergies  Allergen Reactions   Penicillins     Did it involve swelling of the face/tongue/throat, SOB, or low BP? Unknown Did it involve sudden or severe rash/hives, skin peeling, or any reaction on the inside of your mouth or nose? Unknown Did you need to seek medical attention at a hospital or doctor's office? Unknown When did it last happen?  unknown     If all above answers are "NO", may proceed with cephalosporin use.     Macrobid [Nitrofurantoin] Rash   Sulfa Antibiotics Rash    Medications: I have reviewed the patient's current medications.  Results for orders placed or performed during the hospital encounter of 08/11/21 (from the past 48 hour(s))  CBC with Differential     Status: Abnormal (Preliminary result)   Collection Time: 08/11/21  4:08 PM  Result Value Ref Range    WBC 15.6 (H) 4.0 - 10.5 K/uL    Comment: WHITE COUNT CONFIRMED ON SMEAR   RBC 4.63 3.87 - 5.11 MIL/uL   Hemoglobin 14.2 12.0 - 15.0 g/dL   HCT 91.4 78.2 - 95.6 %   MCV 92.4 80.0 - 100.0 fL   MCH 30.7 26.0 - 34.0 pg   MCHC 33.2 30.0 - 36.0 g/dL   RDW 21.3 08.6 - 57.8 %   Platelets 275 150 - 400 K/uL   nRBC 0.0 0.0 - 0.2 %    Comment: Performed at [facility] [address], [place], [place] [address]   Neutrophils Relative % PENDING %   Neutro Abs PENDING 1.7 - 7.7 K/uL   Band Neutrophils PENDING %   Lymphocytes Relative PENDING %   Lymphs Abs PENDING 0.7 - 4.0 K/uL   Monocytes Relative PENDING %   Monocytes Absolute PENDING 0.1 - 1.0 K/uL   Eosinophils Relative PENDING %   Eosinophils Absolute PENDING 0.0 - 0.5 K/uL   Basophils Relative PENDING %   Basophils Absolute PENDING 0.0 - 0.1 K/uL   WBC Morphology PENDING    RBC Morphology PENDING    Smear Review PENDING    Other PENDING %   nRBC PENDING 0 /100 WBC   Metamyelocytes Relative PENDING %   Myelocytes PENDING %  Promyelocytes Relative PENDING %   Blasts PENDING %   Immature Granulocytes PENDING %   Abs Immature Granulocytes PENDING 0.00 - 0.07 K/uL  Basic metabolic panel     Status: Abnormal   Collection Time: 08/11/21  4:08 PM  Result Value Ref Range   Sodium 141 135 - 145 mmol/L   Potassium 3.7 3.5 - 5.1 mmol/L   Chloride 108 98 - 111 mmol/L   CO2 23 22 - 32 mmol/L   Glucose, Bld 268 (H) 70 - 99 mg/dL    Comment: Glucose reference range applies only to samples taken after fasting for at least 8 hours.   BUN 32 (H) 8 - 23 mg/dL   Creatinine, Ser 4.31 (H) 0.44 - 1.00 mg/dL   Calcium 8.9 8.9 - 54.0 mg/dL   GFR, Estimated 45 (L) >60 mL/min    Comment: (NOTE) Calculated using the CKD-EPI Creatinine Equation (2021)    Anion gap 10 5 - 15    Comment: Performed at [facility], [address], [place], [place] [address]  Resp Panel by RT-PCR (Flu A&B, Covid) Anterior Nasal  Swab     Status: None   Collection Time: 08/11/21  4:08 PM   Specimen: Anterior Nasal Swab  Result Value Ref Range   SARS Coronavirus 2 by RT PCR NEGATIVE NEGATIVE    Comment: (NOTE) SARS-CoV-2 target nucleic acids are NOT DETECTED.  The SARS-CoV-2 RNA is generally detectable in upper respiratory specimens during the acute phase of infection. The lowest concentration of SARS-CoV-2 viral copies this assay can detect is 138 copies/mL. A negative result does not preclude SARS-Cov-2 infection and should not be used as the sole basis for treatment or other patient management decisions. A negative result may occur with  improper specimen collection/handling, submission of specimen other than nasopharyngeal swab, presence of viral mutation(s) within the areas targeted by this assay, and inadequate number of viral copies(<138 copies/mL). A negative result must be combined with clinical observations, patient history, and epidemiological information. The expected result is Negative.  Fact Sheet for Patients:  BloggerCourse.com  Fact Sheet for Healthcare Providers:  SeriousBroker.it  This test is no t yet approved or cleared by the [place] FDA and  has been authorized for detection and/or diagnosis of SARS-CoV-2 by FDA under an Emergency Use Authorization (EUA). This EUA will remain  in effect (meaning this test can be used) for the duration of the COVID-19 declaration under Section 564(b)(1) of the Act, 21 U.S.C.section 360bbb-3(b)(1), unless the authorization is terminated  or revoked sooner.       Influenza A by PCR NEGATIVE NEGATIVE   Influenza B by PCR NEGATIVE NEGATIVE    Comment: (NOTE) The Xpert Xpress SARS-CoV-2/FLU/RSV plus assay is intended as an aid in the diagnosis of influenza from Nasopharyngeal swab specimens and should not be used as a sole basis for treatment. Nasal washings and aspirates are unacceptable for  Xpert Xpress SARS-CoV-2/FLU/RSV testing.  Fact Sheet for Patients: BloggerCourse.com  Fact Sheet for Healthcare Providers: SeriousBroker.it  This test is not yet approved or cleared by the [place] FDA and has been authorized for detection and/or diagnosis of SARS-CoV-2 by FDA under an Emergency Use Authorization (EUA). This EUA will remain in effect (meaning this test can be used) for the duration of the COVID-19 declaration under Section 564(b)(1) of the Act, 21 U.S.C. section 360bbb-3(b)(1), unless the authorization is terminated or revoked.  Performed at [facility], [address] [place], [place] [address]  Protime-INR     Status: None   Collection Time: 08/11/21  4:08 PM  Result Value Ref Range   Prothrombin Time 12.6 11.4 - 15.2 seconds   INR 1.0 0.8 - 1.2    Comment: (NOTE) INR goal varies based on device and disease states. Performed at [facility], [address] [place], [place] [address]     CT HEAD WO CONTRAST  Result Date: 08/11/2021 CLINICAL DATA:  Head trauma, moderate-severe; Polytrauma, blunt EXAM: CT HEAD WITHOUT CONTRAST CT CERVICAL SPINE WITHOUT CONTRAST TECHNIQUE: Multidetector CT imaging of the head and cervical spine was performed following the standard protocol without intravenous contrast. Multiplanar CT image reconstructions of the cervical spine were also generated. RADIATION DOSE REDUCTION: This exam was performed according to the departmental dose-optimization program which includes automated exposure control, adjustment of the mA and/or kV according to patient size and/or use of iterative reconstruction technique. COMPARISON:  01/30/2013, 12/06/2012 FINDINGS: CT HEAD FINDINGS Brain: No evidence of acute infarction, hemorrhage, hydrocephalus, extra-axial collection or mass lesion/mass effect. Extensive low-density changes within the periventricular and  subcortical white matter compatible with chronic microvascular ischemic change. Moderate diffuse cerebral volume loss. Vascular: Atherosclerotic calcifications involving the large vessels of the skull base. No unexpected hyperdense vessel. Skull: Normal. Negative for fracture or focal lesion. Sinuses/Orbits: No acute finding. Other: Negative for scalp hematoma. CT CERVICAL SPINE FINDINGS Alignment: Facet joints are aligned without dislocation or traumatic listhesis. Dens and lateral masses are aligned. Skull base and vertebrae: No acute fracture. No primary bone lesion or focal pathologic process. Osseous structures are diffusely demineralized. Soft tissues and spinal canal: No prevertebral fluid or swelling. No visible canal hematoma. Disc levels:  Mild multilevel cervical spondylosis. Upper chest: Included lung apices are clear. Other: Aortic and bilateral carotid atherosclerosis. IMPRESSION: 1. No acute intracranial abnormality. 2. Chronic microvascular ischemic change and moderate diffuse cerebral volume loss. 3. No acute cervical spine fracture or subluxation. 4. Mild multilevel cervical spondylosis. Aortic Atherosclerosis (ICD10-I70.0). Electronically Signed   By: Duanne Guess D.O.   On: 08/11/2021 16:12   CT CERVICAL SPINE WO CONTRAST  Result Date: 08/11/2021 CLINICAL DATA:  Head trauma, moderate-severe; Polytrauma, blunt EXAM: CT HEAD WITHOUT CONTRAST CT CERVICAL SPINE WITHOUT CONTRAST TECHNIQUE: Multidetector CT imaging of the head and cervical spine was performed following the standard protocol without intravenous contrast. Multiplanar CT image reconstructions of the cervical spine were also generated. RADIATION DOSE REDUCTION: This exam was performed according to the departmental dose-optimization program which includes automated exposure control, adjustment of the mA and/or kV according to patient size and/or use of iterative reconstruction technique. COMPARISON:  01/30/2013, 12/06/2012 FINDINGS:  CT HEAD FINDINGS Brain: No evidence of acute infarction, hemorrhage, hydrocephalus, extra-axial collection or mass lesion/mass effect. Extensive low-density changes within the periventricular and subcortical white matter compatible with chronic microvascular ischemic change. Moderate diffuse cerebral volume loss. Vascular: Atherosclerotic calcifications involving the large vessels of the skull base. No unexpected hyperdense vessel. Skull: Normal. Negative for fracture or focal lesion. Sinuses/Orbits: No acute finding. Other: Negative for scalp hematoma. CT CERVICAL SPINE FINDINGS Alignment: Facet joints are aligned without dislocation or traumatic listhesis. Dens and lateral masses are aligned. Skull base and vertebrae: No acute fracture. No primary bone lesion or focal pathologic process. Osseous structures are diffusely demineralized. Soft tissues and spinal canal: No prevertebral fluid or swelling. No visible canal hematoma. Disc levels:  Mild multilevel cervical spondylosis. Upper chest: Included lung apices are clear. Other: Aortic and bilateral carotid atherosclerosis. IMPRESSION: 1. No  acute intracranial abnormality. 2. Chronic microvascular ischemic change and moderate diffuse cerebral volume loss. 3. No acute cervical spine fracture or subluxation. 4. Mild multilevel cervical spondylosis. Aortic Atherosclerosis (ICD10-I70.0). Electronically Signed   By: Duanne Guess D.O.   On: 08/11/2021 16:12   DG Knee Left Port  Result Date: 08/11/2021 CLINICAL DATA:  Fall. EXAM: PORTABLE LEFT KNEE - 1-2 VIEW COMPARISON:  Left knee x-rays dated September 19, 2015. FINDINGS: No acute fracture or dislocation. No joint effusion. Mild tricompartmental degenerative changes. Osteopenia. Soft tissues are unremarkable. IMPRESSION: 1. No acute osseous abnormality. 2. Mild tricompartmental osteoarthritis. Electronically Signed   By: Obie Dredge M.D.   On: 08/11/2021 15:39   DG Chest Port 1 View  Result Date:  08/11/2021 CLINICAL DATA:  Trauma. EXAM: PORTABLE CHEST 1 VIEW COMPARISON:  Chest radiograph 02/26/2013. FINDINGS: Streaky left basilar opacities. No visible pneumothorax or pleural effusions. Biapical pleuroparenchymal scarring. Mild enlargement the cardiac silhouette. No displaced fracture identified. Polyarticular degenerative change. IMPRESSION: Streaky left basilar opacities, which could represent atelectasis (favored), aspiration, and/or pneumonia. Electronically Signed   By: Feliberto Harts M.D.   On: 08/11/2021 15:34   DG Hip Unilat With Pelvis 2-3 Views Left  Result Date: 08/11/2021 CLINICAL DATA:  Fall EXAM: DG HIP (WITH OR WITHOUT PELVIS) 2-3V LEFT COMPARISON:  None Available. FINDINGS: Significantly displaced fracture of the LEFT femoral neck, intertrochanteric region, with at least some degree of impaction at the fracture site and with nearly 90% angulation deformity at the fracture site. Femoral head remains grossly well positioned relative to the acetabulum. Probable old healed fracture of the LEFT symphysis pubis. IMPRESSION: LEFT femoral neck fracture, as detailed above. Electronically Signed   By: Bary Richard M.D.   On: 08/11/2021 15:32    ROS:  no recent f/c/n/v/wt loss.  10 system review is o/w negative. PE:  Blood pressure (!) 166/74, pulse 80, temperature 97.8 F (36.6 C), temperature source Oral, resp. rate 17, SpO2 95 %. Thin healthy appearing elderly woman in nad.  A and O to person and purpose.  Confused about the date and location.  L LE slightly shortened and externally rotated.  1+ DP pulse.  Skin of L LE heatlhy and intact.  No significant pain with gentle IR and ER of the hip (block).  Intact sens to LT at the toes dorsally and plantarly.  5/5 strength in PF and DF of the ankle and toes.  No lymphadenopathy.  Assessment/Plan: R hip displaced basicervical femoral neck fracture - I explained the nature of the injury to the patient in detail.  She will need L hip hemi vs.  Total hip arthroplasty to allow early wb post op.  We'll schedule for Wednesday with Dr. Linna Caprice.  Continue SCDs and hold blood thinners.  Ok to eat tomorrow.  NPO after midnight on the 4th.  Toni Arthurs 08/11/2021, 9:08 PM

## 2021-08-11 NOTE — H&P (Signed)
History and Physical    Patient: Jennifer Gallegos ESP:[id number] DOB: 1919/12/02 DOA: 08/11/2021 DOS: the patient was seen and examined on 08/11/2021 PCP: Andreas Blower., MD  Patient coming from: ALF/ILF  Chief Complaint:  Chief Complaint  Patient presents with   Fall   HPI: Jennifer Gallegos is a 88 y.o. female with medical history significant for but not limited to hypertension and hypothyroidism as well as a history of a hysterectomy and bladder pain who presents after mechanical fall while ambulating off the curb and falling on her left hip with immediate pain and inability to bear weight.  Patient is a very functional 88 year old who ambulates quite well and is having plans for her 89th birthday party next month.  Today she was ambulating around 1 PM at her facility and stepped off the curb and had a mechanical fall and fell on her left hip.  She had immediate pain and was unable to bear weight area get up.  EMS was called and she presented to the hospital and found to have a left femoral neck fracture.  Orthopedic surgery was consulted and recommended medical admission for pain control and recommended a fascia iliac as nerve block as well as likely surgical management.  Orthopedic surgery is likely planning surgical intervention sometime on 08/12/2021 possibly however timing of surgery is unclear.  Patient states that her pain is fairly well controlled on denies any nausea or vomiting.  No other concerns or complaints this time and will be admitted to the orthopedic floor  Review of Systems: As mentioned in the history of present illness. All other systems reviewed and are negative. Past Medical History:  Diagnosis Date   Hypertension    Thyroid disease    Past Surgical History:  Procedure Laterality Date   ABDOMINAL HYSTERECTOMY     APPENDECTOMY     TONSILLECTOMY     Social History:  reports that she has never smoked. She has never used smokeless tobacco. She reports that she does not drink  alcohol and does not use drugs.  Allergies  Allergen Reactions   Penicillins Itching   Macrobid [Nitrofurantoin] Rash   Sulfa Antibiotics Rash   FAMILY HISTORY  History is reviewed with patient and patient's father died at an early age and patient's mother died of liver cancer.  Not much history is remembered given the patient is 53  Prior to Admission medications   Medication Sig Start Date End Date Taking? Authorizing Provider  amLODipine-benazepril (LOTREL) 5-10 MG capsule Take 1 capsule by mouth daily.    [provider]  levothyroxine (SYNTHROID, LEVOTHROID) 88 MCG tablet Take 88 mcg by mouth daily before breakfast.    [provider]   Physical Exam: Vitals:   08/11/21 1819 08/11/21 1826 08/11/21 1827 08/11/21 1828  BP:  (!) 165/73    Pulse: 85 87 85 86  Resp: (!) 25 20 (!) 27 (!) 24  Temp:      TempSrc:      SpO2: 98% 98% 98% 98%   Examination: Physical Exam:  Constitutional: Thin elderly Caucasian female currently in no acute distress appears little anxious Respiratory: Diminished to auscultation bilaterally, no wheezing, rales, rhonchi or crackles. Normal respiratory effort and patient is not tachypenic. No accessory muscle use.  Unlabored breathing Cardiovascular: RRR, no murmurs / rubs / gallops. S1 and S2 auscultated.  Abdomen: Soft, non-tender, non-distended.  Bowel sounds positive.  GU: Deferred. Musculoskeletal: No clubbing / cyanosis of digits/nails.  Left leg is shorter and externally rotated  Skin: No rashes, lesions, ulcers on limited skin evaluation. No induration; Warm and dry.  Neurologic: CN 2-12 grossly intact with no focal deficits. Romberg sign and cerebellar reflexes not assessed.  Psychiatric: Normal judgment and insight. Alert and oriented x 3.  Patient appears little anxious  Data Reviewed: Laboratory data was reviewed and she has a slight hyperglycemia of 268, BUN/creatinine 32/1.11 with a GFR of 45 and a small leukocytosis  15.6  EKG was done and personally interpreted and showed a sinus rhythm with a rate of 88 and QTc of 477 with right atrial enlargement and right bundle branch block  Chest x-ray personally reviewed and agree with the read of atelectasis  Assessment and Plan: No notes have been filed under this hospital service. Service: Hospitalist  Left Femoral Neck Fracture -In the setting of mechanical fall; Trauma Eval done and Heat CT and Cervical Spine CT w/o Contrast showed "No acute intracranial abnormality. Chronic microvascular ischemic change and moderate diffuse cerebral volume loss. No acute cervical spine fracture or subluxation. Mild multilevel cervical spondylosis." -Orthopedic surgery recommends medical admission and will consult -They request a nerve block which has been ordered -We will admit with pain control and continue with IV analgesics as well as p.o. analgesics as necessary -Patient has inability to bear weight and her left leg is shorter and externally rotated -Check vitamin D level -Start iron supplementation -VTE prophylaxis with enoxaparin -Bowel regimen initiated -Continue with methocarbamol -PT and OT to further evaluate after -Further plans and care per orthopedic surgery  Leukocytosis -In the setting of mechanical fall and pain -WBC 15.6 -Differential on CBC is pending but will need to continue monitor for signs and symptoms of infection -Urinalysis is pending -Repeat CBC in the a.m.  Hypertension -Hold all antihypertensives of amlodipine and benazepril combination -Continue to monitor blood pressures per protocol -Last blood pressure reading was 146/77  Hypothyroidism -Check TSH in the a.m. -Continue home levothyroxine  Renal insufficiency/chronic kidney disease stage IIIb likely -Patient presents with a BUN/creatinine of 32/1.11 -Gentle IV fluid hydration and hold her antihypertensives including the benazepril amlodipine combination -Avoid further  nephrotoxic medications, contrast dyes, hypotension and dehydration and renally adjust medications -Repeat CMP in a.m.  Hyperglycemia -Likely reactive -Given the patient's age it is no benefit in checking a hemoglobin A1c -Continue monitor blood sugars carefully and if necessary will start low-dose sliding scale insulin  Atelectasis -Noted on chest x-ray -We will start flutter valve and incentive spirometry -Continue monitor respiratory status carefully and if necessary provide her with supplemental oxygen nasal cannula   Advance Care Planning:   Code Status: DNR patient is a DO NOT RESUSCITATE  Consults: Orthopedic surgery was called by the EDP and they spoke with Dr. Victorino Dike of [facility]  Family Communication: Discussed with her 2 sons at bedside  Severity of Illness: The appropriate patient status for this patient is INPATIENT. Inpatient status is judged to be reasonable and necessary in order to provide the required intensity of service to ensure the patient's safety. The patient's presenting symptoms, physical exam findings, and initial radiographic and laboratory data in the context of their chronic comorbidities is felt to place them at high risk for further clinical deterioration. Furthermore, it is not anticipated that the patient will be medically stable for discharge from the hospital within 2 midnights of admission.   * I certify that at the point of admission it is my clinical judgment that the patient will require inpatient hospital care spanning beyond 2 midnights from  the point of admission due to high intensity of service, high risk for further deterioration and high frequency of surveillance required.*  Author: Marguerita Merles, DO 08/11/2021 6:34 PM  For on call review [email or web address].

## 2021-08-11 NOTE — Progress Notes (Signed)
AssistedDr. Lowella Petties with left, pericapsular nerve group (PENG), ultrasound guided block. Side rails up, monitors on throughout procedure. See vital signs in flow sheet. Tolerated Procedure well.

## 2021-08-11 NOTE — Consult Note (Signed)
Reason for Consult:left hip pain Referring Physician: Dr. Andreas Newport is an 88 y.o. female.  HPI: 88 y/o female without significant PMH injured her left hip when she fell this morning.  She reports that she got up from a seated position and was too close to a curb and caught her foot on it as she stepped.  She fell on her buttocks and had immediate pain in the left groin.  She was unable to bear weight and was brought to the ED for eval.  She has a h/o sacral insufficiency fracture treated at [facility].  She c/o sharp pain in the groin with any motion.  Pain is better after anesthesia fascia iliaca block.  She is not a smoker and is not diabetic.  She denies any h/o surgery to the left hip.  Past Medical History:  Diagnosis Date   Hypertension    Thyroid disease     Past Surgical History:  Procedure Laterality Date   ABDOMINAL HYSTERECTOMY     APPENDECTOMY     TONSILLECTOMY      History reviewed. No pertinent family history.  Social History:  reports that she has never smoked. She has never used smokeless tobacco. She reports that she does not drink alcohol and does not use drugs.  Allergies:  Allergies  Allergen Reactions   Penicillins     Did it involve swelling of the face/tongue/throat, SOB, or low BP? Unknown Did it involve sudden or severe rash/hives, skin peeling, or any reaction on the inside of your mouth or nose? Unknown Did you need to seek medical attention at a hospital or doctor's office? Unknown When did it last happen?  unknown     If all above answers are "NO", may proceed with cephalosporin use.     Macrobid [Nitrofurantoin] Rash   Sulfa Antibiotics Rash    Medications: I have reviewed the patient's current medications.  Results for orders placed or performed during the hospital encounter of 08/11/21 (from the past 48 hour(s))  CBC with Differential     Status: Abnormal (Preliminary result)   Collection Time: 08/11/21  4:08 PM  Result Value Ref Range    WBC 15.6 (H) 4.0 - 10.5 K/uL    Comment: WHITE COUNT CONFIRMED ON SMEAR   RBC 4.63 3.87 - 5.11 MIL/uL   Hemoglobin 14.2 12.0 - 15.0 g/dL   HCT 91.4 78.2 - 95.6 %   MCV 92.4 80.0 - 100.0 fL   MCH 30.7 26.0 - 34.0 pg   MCHC 33.2 30.0 - 36.0 g/dL   RDW 21.3 08.6 - 57.8 %   Platelets 275 150 - 400 K/uL   nRBC 0.0 0.0 - 0.2 %    Comment: Performed at [facility] [address], [place], [place] [address]   Neutrophils Relative % PENDING %   Neutro Abs PENDING 1.7 - 7.7 K/uL   Band Neutrophils PENDING %   Lymphocytes Relative PENDING %   Lymphs Abs PENDING 0.7 - 4.0 K/uL   Monocytes Relative PENDING %   Monocytes Absolute PENDING 0.1 - 1.0 K/uL   Eosinophils Relative PENDING %   Eosinophils Absolute PENDING 0.0 - 0.5 K/uL   Basophils Relative PENDING %   Basophils Absolute PENDING 0.0 - 0.1 K/uL   WBC Morphology PENDING    RBC Morphology PENDING    Smear Review PENDING    Other PENDING %   nRBC PENDING 0 /100 WBC   Metamyelocytes Relative PENDING %   Myelocytes PENDING %  Promyelocytes Relative PENDING %   Blasts PENDING %   Immature Granulocytes PENDING %   Abs Immature Granulocytes PENDING 0.00 - 0.07 K/uL  Basic metabolic panel     Status: Abnormal   Collection Time: 08/11/21  4:08 PM  Result Value Ref Range   Sodium 141 135 - 145 mmol/L   Potassium 3.7 3.5 - 5.1 mmol/L   Chloride 108 98 - 111 mmol/L   CO2 23 22 - 32 mmol/L   Glucose, Bld 268 (H) 70 - 99 mg/dL    Comment: Glucose reference range applies only to samples taken after fasting for at least 8 hours.   BUN 32 (H) 8 - 23 mg/dL   Creatinine, Ser 4.31 (H) 0.44 - 1.00 mg/dL   Calcium 8.9 8.9 - 54.0 mg/dL   GFR, Estimated 45 (L) >60 mL/min    Comment: (NOTE) Calculated using the CKD-EPI Creatinine Equation (2021)    Anion gap 10 5 - 15    Comment: Performed at [facility], [address], [place], [place] [address]  Resp Panel by RT-PCR (Flu A&B, Covid) Anterior Nasal  Swab     Status: None   Collection Time: 08/11/21  4:08 PM   Specimen: Anterior Nasal Swab  Result Value Ref Range   SARS Coronavirus 2 by RT PCR NEGATIVE NEGATIVE    Comment: (NOTE) SARS-CoV-2 target nucleic acids are NOT DETECTED.  The SARS-CoV-2 RNA is generally detectable in upper respiratory specimens during the acute phase of infection. The lowest concentration of SARS-CoV-2 viral copies this assay can detect is 138 copies/mL. A negative result does not preclude SARS-Cov-2 infection and should not be used as the sole basis for treatment or other patient management decisions. A negative result may occur with  improper specimen collection/handling, submission of specimen other than nasopharyngeal swab, presence of viral mutation(s) within the areas targeted by this assay, and inadequate number of viral copies(<138 copies/mL). A negative result must be combined with clinical observations, patient history, and epidemiological information. The expected result is Negative.  Fact Sheet for Patients:  BloggerCourse.com  Fact Sheet for Healthcare Providers:  SeriousBroker.it  This test is no t yet approved or cleared by the [place] FDA and  has been authorized for detection and/or diagnosis of SARS-CoV-2 by FDA under an Emergency Use Authorization (EUA). This EUA will remain  in effect (meaning this test can be used) for the duration of the COVID-19 declaration under Section 564(b)(1) of the Act, 21 U.S.C.section 360bbb-3(b)(1), unless the authorization is terminated  or revoked sooner.       Influenza A by PCR NEGATIVE NEGATIVE   Influenza B by PCR NEGATIVE NEGATIVE    Comment: (NOTE) The Xpert Xpress SARS-CoV-2/FLU/RSV plus assay is intended as an aid in the diagnosis of influenza from Nasopharyngeal swab specimens and should not be used as a sole basis for treatment. Nasal washings and aspirates are unacceptable for  Xpert Xpress SARS-CoV-2/FLU/RSV testing.  Fact Sheet for Patients: BloggerCourse.com  Fact Sheet for Healthcare Providers: SeriousBroker.it  This test is not yet approved or cleared by the [place] FDA and has been authorized for detection and/or diagnosis of SARS-CoV-2 by FDA under an Emergency Use Authorization (EUA). This EUA will remain in effect (meaning this test can be used) for the duration of the COVID-19 declaration under Section 564(b)(1) of the Act, 21 U.S.C. section 360bbb-3(b)(1), unless the authorization is terminated or revoked.  Performed at [facility], [address] [place], [place] [address]  Protime-INR     Status: None   Collection Time: 08/11/21  4:08 PM  Result Value Ref Range   Prothrombin Time 12.6 11.4 - 15.2 seconds   INR 1.0 0.8 - 1.2    Comment: (NOTE) INR goal varies based on device and disease states. Performed at [facility], [address] [place], [place] [address]     CT HEAD WO CONTRAST  Result Date: 08/11/2021 CLINICAL DATA:  Head trauma, moderate-severe; Polytrauma, blunt EXAM: CT HEAD WITHOUT CONTRAST CT CERVICAL SPINE WITHOUT CONTRAST TECHNIQUE: Multidetector CT imaging of the head and cervical spine was performed following the standard protocol without intravenous contrast. Multiplanar CT image reconstructions of the cervical spine were also generated. RADIATION DOSE REDUCTION: This exam was performed according to the departmental dose-optimization program which includes automated exposure control, adjustment of the mA and/or kV according to patient size and/or use of iterative reconstruction technique. COMPARISON:  01/30/2013, 12/06/2012 FINDINGS: CT HEAD FINDINGS Brain: No evidence of acute infarction, hemorrhage, hydrocephalus, extra-axial collection or mass lesion/mass effect. Extensive low-density changes within the periventricular and  subcortical white matter compatible with chronic microvascular ischemic change. Moderate diffuse cerebral volume loss. Vascular: Atherosclerotic calcifications involving the large vessels of the skull base. No unexpected hyperdense vessel. Skull: Normal. Negative for fracture or focal lesion. Sinuses/Orbits: No acute finding. Other: Negative for scalp hematoma. CT CERVICAL SPINE FINDINGS Alignment: Facet joints are aligned without dislocation or traumatic listhesis. Dens and lateral masses are aligned. Skull base and vertebrae: No acute fracture. No primary bone lesion or focal pathologic process. Osseous structures are diffusely demineralized. Soft tissues and spinal canal: No prevertebral fluid or swelling. No visible canal hematoma. Disc levels:  Mild multilevel cervical spondylosis. Upper chest: Included lung apices are clear. Other: Aortic and bilateral carotid atherosclerosis. IMPRESSION: 1. No acute intracranial abnormality. 2. Chronic microvascular ischemic change and moderate diffuse cerebral volume loss. 3. No acute cervical spine fracture or subluxation. 4. Mild multilevel cervical spondylosis. Aortic Atherosclerosis (ICD10-I70.0). Electronically Signed   By: Duanne Guess D.O.   On: 08/11/2021 16:12   CT CERVICAL SPINE WO CONTRAST  Result Date: 08/11/2021 CLINICAL DATA:  Head trauma, moderate-severe; Polytrauma, blunt EXAM: CT HEAD WITHOUT CONTRAST CT CERVICAL SPINE WITHOUT CONTRAST TECHNIQUE: Multidetector CT imaging of the head and cervical spine was performed following the standard protocol without intravenous contrast. Multiplanar CT image reconstructions of the cervical spine were also generated. RADIATION DOSE REDUCTION: This exam was performed according to the departmental dose-optimization program which includes automated exposure control, adjustment of the mA and/or kV according to patient size and/or use of iterative reconstruction technique. COMPARISON:  01/30/2013, 12/06/2012 FINDINGS:  CT HEAD FINDINGS Brain: No evidence of acute infarction, hemorrhage, hydrocephalus, extra-axial collection or mass lesion/mass effect. Extensive low-density changes within the periventricular and subcortical white matter compatible with chronic microvascular ischemic change. Moderate diffuse cerebral volume loss. Vascular: Atherosclerotic calcifications involving the large vessels of the skull base. No unexpected hyperdense vessel. Skull: Normal. Negative for fracture or focal lesion. Sinuses/Orbits: No acute finding. Other: Negative for scalp hematoma. CT CERVICAL SPINE FINDINGS Alignment: Facet joints are aligned without dislocation or traumatic listhesis. Dens and lateral masses are aligned. Skull base and vertebrae: No acute fracture. No primary bone lesion or focal pathologic process. Osseous structures are diffusely demineralized. Soft tissues and spinal canal: No prevertebral fluid or swelling. No visible canal hematoma. Disc levels:  Mild multilevel cervical spondylosis. Upper chest: Included lung apices are clear. Other: Aortic and bilateral carotid atherosclerosis. IMPRESSION: 1. No  acute intracranial abnormality. 2. Chronic microvascular ischemic change and moderate diffuse cerebral volume loss. 3. No acute cervical spine fracture or subluxation. 4. Mild multilevel cervical spondylosis. Aortic Atherosclerosis (ICD10-I70.0). Electronically Signed   By: Duanne Guess D.O.   On: 08/11/2021 16:12   DG Knee Left Port  Result Date: 08/11/2021 CLINICAL DATA:  Fall. EXAM: PORTABLE LEFT KNEE - 1-2 VIEW COMPARISON:  Left knee x-rays dated September 19, 2015. FINDINGS: No acute fracture or dislocation. No joint effusion. Mild tricompartmental degenerative changes. Osteopenia. Soft tissues are unremarkable. IMPRESSION: 1. No acute osseous abnormality. 2. Mild tricompartmental osteoarthritis. Electronically Signed   By: Obie Dredge M.D.   On: 08/11/2021 15:39   DG Chest Port 1 View  Result Date:  08/11/2021 CLINICAL DATA:  Trauma. EXAM: PORTABLE CHEST 1 VIEW COMPARISON:  Chest radiograph 02/26/2013. FINDINGS: Streaky left basilar opacities. No visible pneumothorax or pleural effusions. Biapical pleuroparenchymal scarring. Mild enlargement the cardiac silhouette. No displaced fracture identified. Polyarticular degenerative change. IMPRESSION: Streaky left basilar opacities, which could represent atelectasis (favored), aspiration, and/or pneumonia. Electronically Signed   By: Feliberto Harts M.D.   On: 08/11/2021 15:34   DG Hip Unilat With Pelvis 2-3 Views Left  Result Date: 08/11/2021 CLINICAL DATA:  Fall EXAM: DG HIP (WITH OR WITHOUT PELVIS) 2-3V LEFT COMPARISON:  None Available. FINDINGS: Significantly displaced fracture of the LEFT femoral neck, intertrochanteric region, with at least some degree of impaction at the fracture site and with nearly 90% angulation deformity at the fracture site. Femoral head remains grossly well positioned relative to the acetabulum. Probable old healed fracture of the LEFT symphysis pubis. IMPRESSION: LEFT femoral neck fracture, as detailed above. Electronically Signed   By: Bary Richard M.D.   On: 08/11/2021 15:32    ROS:  no recent f/c/n/v/wt loss.  10 system review is o/w negative. PE:  Blood pressure (!) 166/74, pulse 80, temperature 97.8 F (36.6 C), temperature source Oral, resp. rate 17, SpO2 95 %. Thin healthy appearing elderly woman in nad.  A and O to person and purpose.  Confused about the date and location.  L LE slightly shortened and externally rotated.  1+ DP pulse.  Skin of L LE heatlhy and intact.  No significant pain with gentle IR and ER of the hip (block).  Intact sens to LT at the toes dorsally and plantarly.  5/5 strength in PF and DF of the ankle and toes.  No lymphadenopathy.  Assessment/Plan: R hip displaced basicervical femoral neck fracture - I explained the nature of the injury to the patient in detail.  She will need L hip hemi vs.  Total hip arthroplasty to allow early wb post op.  We'll schedule for Wednesday with Dr. Linna Caprice.  Continue SCDs and hold blood thinners.  Ok to eat tomorrow.  NPO after midnight on the 4th.  Toni Arthurs 08/11/2021, 9:08 PM

## 2021-08-11 NOTE — Anesthesia Preprocedure Evaluation (Signed)
Anesthesia Evaluation  Patient identified by MRN, date of birth, ID band Patient awake    Reviewed: Allergy & Precautions, Patient's Chart, lab work & pertinent test results  Airway Mallampati: II  TM Distance: >3 FB Neck ROM: Full    Dental no notable dental hx.    Pulmonary neg pulmonary ROS,    Pulmonary exam normal breath sounds clear to auscultation       Cardiovascular hypertension, Pt. on medications Normal cardiovascular exam Rhythm:Regular Rate:Normal     Neuro/Psych negative neurological ROS  negative psych ROS   GI/Hepatic negative GI ROS, Neg liver ROS,   Endo/Other  Hypothyroidism   Renal/GU negative Renal ROS  negative genitourinary   Musculoskeletal L hip fx   Abdominal   Peds negative pediatric ROS (+)  Hematology negative hematology ROS (+) Hb 14.2, plt 275   Anesthesia Other Findings   Reproductive/Obstetrics negative OB ROS                             Anesthesia Physical Anesthesia Plan  ASA: 2  Anesthesia Plan: Regional   Post-op Pain Management: Regional block*   Induction:   PONV Risk Score and Plan: Treatment may vary due to age or medical condition  Airway Management Planned: Natural Airway  Additional Equipment: None  Intra-op Plan:   Post-operative Plan:   Informed Consent: I have reviewed the patients History and Physical, chart, labs and discussed the procedure including the risks, benefits and alternatives for the proposed anesthesia with the patient or authorized representative who has indicated his/her understanding and acceptance.   Patient has DNR.     Plan Discussed with:   Anesthesia Plan Comments:         Anesthesia Quick Evaluation

## 2021-08-12 ENCOUNTER — Inpatient Hospital Stay (HOSPITAL_COMMUNITY): Payer: Medicare Other

## 2021-08-12 DIAGNOSIS — D72829 Elevated white blood cell count, unspecified: Secondary | ICD-10-CM | POA: Diagnosis not present

## 2021-08-12 DIAGNOSIS — E039 Hypothyroidism, unspecified: Secondary | ICD-10-CM | POA: Diagnosis not present

## 2021-08-12 DIAGNOSIS — S72002A Fracture of unspecified part of neck of left femur, initial encounter for closed fracture: Secondary | ICD-10-CM | POA: Diagnosis not present

## 2021-08-12 DIAGNOSIS — R739 Hyperglycemia, unspecified: Secondary | ICD-10-CM | POA: Diagnosis not present

## 2021-08-12 LAB — BASIC METABOLIC PANEL
Anion gap: 7 (ref 5–15)
BUN: 28 mg/dL — ABNORMAL HIGH (ref 8–23)
CO2: 22 mmol/L (ref 22–32)
Calcium: 8.7 mg/dL — ABNORMAL LOW (ref 8.9–10.3)
Chloride: 111 mmol/L (ref 98–111)
Creatinine, Ser: 0.98 mg/dL (ref 0.44–1.00)
GFR, Estimated: 52 mL/min — ABNORMAL LOW (ref >60)
Glucose, Bld: 210 mg/dL — ABNORMAL HIGH (ref 70–99)
Potassium: 4.1 mmol/L (ref 3.5–5.1)
Sodium: 140 mmol/L (ref 135–145)

## 2021-08-12 LAB — CBC
HCT: 40 % (ref 36.0–46.0)
Hemoglobin: 13.2 g/dL (ref 12.0–15.0)
MCH: 30.3 pg (ref 26.0–34.0)
MCHC: 33 g/dL (ref 30.0–36.0)
MCV: 92 fL (ref 80.0–100.0)
Platelets: 242 10*3/uL (ref 150–400)
RBC: 4.35 MIL/uL (ref 3.87–5.11)
RDW: 13.2 % (ref 11.5–15.5)
WBC: 17.6 10*3/uL — ABNORMAL HIGH (ref 4.0–10.5)
nRBC: 0 % (ref 0.0–0.2)

## 2021-08-12 LAB — SURGICAL PCR SCREEN
MRSA, PCR: NEGATIVE
Staphylococcus aureus: POSITIVE — AB

## 2021-08-12 MED ORDER — ADULT MULTIVITAMIN W/MINERALS CH
1.0000 | ORAL_TABLET | Freq: Every day | ORAL | Status: DC
  Administered 2021-08-12 – 2021-08-15 (×3): 1 via ORAL
  Filled 2021-08-12 (×3): qty 1

## 2021-08-12 MED ORDER — ENSURE ENLIVE PO LIQD
237.0000 mL | Freq: Two times a day (BID) | ORAL | Status: DC
  Administered 2021-08-12 – 2021-08-15 (×5): 237 mL via ORAL

## 2021-08-12 NOTE — Progress Notes (Signed)
Initial Nutrition Assessment  INTERVENTION:   -Recommend liberalize diet to regular following surgery  -Ensure Plus High Protein po BID, each supplement provides 350 kcal and 20 grams of protein.   -Multivitamin with minerals daily  NUTRITION DIAGNOSIS:   Increased nutrient needs related to acute illness as evidenced by estimated needs.  GOAL:   Patient will meet greater than or equal to 90% of their needs   MONITOR:   PO intake, Supplement acceptance, Labs, Weight trends, I & O's  REASON FOR ASSESSMENT:   Consult Hip fracture protocol  ASSESSMENT:   88 y.o. female with medical history significant for but not limited to hypertension and hypothyroidism as well as a history of a hysterectomy and bladder pain who presents after mechanical fall while ambulating off the curb and falling on her left hip with immediate pain and inability to bear weight.  Patient in room, sleeping. No family at bedside. Pt with poor appetite, not wanting to eat much. Will add Ensure supplements. Also recommend diet liberalization following hip surgery tomorrow. Pt to be NPO at midnight.  Per weight records, no weight loss noted.  Medications: Colace, Ferrous sulfate  Labs reviewed:  Low Vitamin D (11.5)  NUTRITION - FOCUSED PHYSICAL EXAM:  Sleeping, did not disturb   Diet Order:   Diet Order             Diet NPO time specified  Diet effective midnight           Diet Heart Room service appropriate? Yes; Fluid consistency: Thin  Diet effective now                   EDUCATION NEEDS:   Not appropriate for education at this time  Skin:  Skin Assessment: Reviewed RN Assessment  Last BM:  7/3  Height:   Ht Readings from Last 1 Encounters:  08/11/21 5' (1.524 m)    Weight:   Wt Readings from Last 1 Encounters:  08/11/21 46.9 kg    BMI:  Body mass index is 20.19 kg/m.  Estimated Nutritional Needs:   Kcal:  1250-1450  Protein:  65-75g  Fluid:   1.5L/day   Tilda Franco, MS, RD, LDN Inpatient Clinical Dietitian Contact information available via Amion

## 2021-08-12 NOTE — Progress Notes (Signed)
PROGRESS NOTE    Jennifer Gallegos  WUJ:[id number] DOB: 1919-11-11 DOA: 08/11/2021 PCP: Andreas Blower., MD   Brief Narrative:  Jennifer Gallegos is a 88 y.o. female with medical history significant for but not limited to hypertension and hypothyroidism as well as a history of a hysterectomy and bladder pain who presents after mechanical fall while ambulating off the curb and falling on her left hip with immediate pain and inability to bear weight.  Patient is a very functional 88 year old who ambulates quite well and is having plans for her 89th birthday party next month.  Today she was ambulating around 1 PM at her facility and stepped off the curb and had a mechanical fall and fell on her left hip.  She had immediate pain and was unable to bear weight area get up.  EMS was called and she presented to the hospital and found to have a left femoral neck fracture.  Orthopedic surgery was consulted and recommended medical admission for pain control and recommended a fascia iliac as nerve block as well as likely surgical management.  Orthopedic surgery is likely planning surgical intervention sometime on 08/12/2021 possibly however timing of surgery is unclear.  Patient states that her pain is fairly well controlled on denies any nausea or vomiting.  No other concerns or complaints this time and will be admitted to the orthopedic floor   Assessment and Plan:  Left Femoral Neck Fracture -In the setting of mechanical fall; Trauma Eval done and Heat CT and Cervical Spine CT w/o Contrast showed "No acute intracranial abnormality. Chronic microvascular ischemic change and moderate diffuse cerebral volume loss. No acute cervical spine fracture or subluxation. Mild multilevel cervical spondylosis." -Orthopedic surgery recommends medical admission and will consult -They request a nerve block which has been ordered -We will admit with pain control and continue with IV analgesics as well as p.o. analgesics as  necessary -Patient has inability to bear weight and her left leg is shorter and externally rotated -Check vitamin D level and was low at 11.50 -Start iron supplementation -VTE prophylaxis with enoxaparin -Bowel regimen initiated -Continue with methocarbamol -PT and OT to further evaluate after -Further plans and care per orthopedic surgery and planning operative fixation with Dr. Linna Caprice on 08/13/21 with either a THA vs IM nail for Intertrochanteric Fx -She will be NPO at Midnight    Leukocytosis -In the setting of mechanical fall and pain -WBC 15.6 on admission and slightly worsened to 17.6 -Continue monitor for signs and symptoms of infection -Urinalysis as below -Currently Not symptomatic and afebrile so not inclined to treat with Abx so will continue monitoring off of Abx -Repeat CBC in the a.m.   Hypertension -Hold all antihypertensives of amlodipine and benazepril combination -Continue to monitor blood pressures per protocol -If Necessary will place her on PRN Hydralazine  -Last blood pressure reading was 150/73   Hypothyroidism -Check TSH in the a.m. -Continue home levothyroxine   Renal insufficiency/chronic kidney disease stage IIIa likely -Patient presents with a BUN/creatinine of 32/1.11 and is now improved to 28/0.98 -U/A done and showed Cloudy Appearance, Yellow Color Urine,>500 Glucose, Small Hgb, Small Leukocytes, Negative Nitrites, Rare Bacteria, 0-5 RBC/HPF, 0-5 Squamous Epithelial Cells, 21-50 WBC but no Sx of Infection given no Burning or discomfort; Will hold off on Treating  -Gentle IV fluid hydration and hold her antihypertensives including the benazepril amlodipine combination -Avoid further nephrotoxic medications, contrast dyes, hypotension and dehydration and renally adjust medications -Repeat CMP in a.m.   Hyperglycemia -Likely reactive;  Blood Sugars ranging from 210-268  -Given the patient's age it is no benefit in checking a hemoglobin A1c -Continue  monitor blood sugars carefully and if necessary will start low-dose sliding scale insulin   Atelectasis -Noted on chest x-ray -We will start flutter valve and incentive spirometry -Continue monitor respiratory status carefully and if necessary provide her with supplemental oxygen nasal cannula    DVT prophylaxis: SCDs Start: 08/11/21 1818    Code Status: DNR Family Communication: No family present at bedside  Disposition Plan:  Level of care: Telemetry Status is: Inpatient Remains inpatient appropriate because: Needs Surgical Intervention and plans for Surgery on 08/13/21   Consultants:  Orthopedic Surgery Anesthesia for Nerve Block  Procedures:  Nerve Block   Antimicrobials:  Anti-infectives (From admission, onward)    None       Subjective: Seen and examined at bedside and was not really hungry and didn't want to eat her food because she stated she wanted salt on it. Complains of her left hearing aid not working properly. States pain in hip is relatively well controlled. No CP or SOB. No other concerns or complaints at this time.   Objective: Vitals:   08/11/21 1952 08/11/21 2152 08/12/21 0206 08/12/21 0626  BP: (!) 166/74 (!) 159/81 (!) 158/85 (!) 150/73  Pulse: 80 84 91 85  Resp: 17 18 17 17   Temp: 97.8 F (36.6 C) (!) 97.5 F (36.4 C) 97.7 F (36.5 C) (!) 97.5 F (36.4 C)  TempSrc: Oral Oral Oral Oral  SpO2: 95% 95% 96% 96%  Weight: 46.9 kg     Height: 5' (1.524 m)       Intake/Output Summary (Last 24 hours) at 08/12/2021 1003 Last data filed at 08/12/2021 10/13/2021 Gross per 24 hour  Intake 1234.98 ml  Output 700 ml  Net 534.98 ml   Filed Weights   08/11/21 1952  Weight: 46.9 kg   Examination: Physical Exam:  Constitutional: Thin elderly Caucasian female in NAD appears calm and comfortable  Respiratory: Diminished to auscultation bilaterally, no wheezing, rales, rhonchi or crackles. Normal respiratory effort and patient is not tachypenic. No accessory  muscle use. Unlabored breathing Cardiovascular: RRR, no murmurs / rubs / gallops. S1 and S2 auscultated. No extremity edema.  Abdomen: Soft, non-tender, non-distended. Bowel sounds positive.  GU: Deferred. Musculoskeletal: No clubbing / cyanosis of digits/nails.Left Leg is shorter and externally rotated Skin: No rashes, lesions, ulcers on a limited skin evaluation. No induration; Warm and dry.  Neurologic: CN 2-12 grossly intact with no focal deficits. Romberg sign and cerebellar reflexes not assessed.  Psychiatric: Normal judgment and insight. Alert and oriented x 3. Normal mood and appropriate affect.   Data Reviewed: I have personally reviewed following labs and imaging studies  CBC: Recent Labs  Lab 08/11/21 1608 08/12/21 0324  WBC 15.6* 17.6*  NEUTROABS 13.0*  --   HGB 14.2 13.2  HCT 42.8 40.0  MCV 92.4 92.0  PLT 275 242   Basic Metabolic Panel: Recent Labs  Lab 08/11/21 1608 08/12/21 0324  NA 141 140  K 3.7 4.1  CL 108 111  CO2 23 22  GLUCOSE 268* 210*  BUN 32* 28*  CREATININE 1.11* 0.98  CALCIUM 8.9 8.7*   GFR: Estimated Creatinine Clearance: 22.5 mL/min (by C-G formula based on SCr of 0.98 mg/dL). Liver Function Tests: No results for input(s): "AST", "ALT", "ALKPHOS", "BILITOT", "PROT", "ALBUMIN" in the last 168 hours. No results for input(s): "LIPASE", "AMYLASE" in the last 168 hours. No results for  input(s): "AMMONIA" in the last 168 hours. Coagulation Profile: Recent Labs  Lab 08/11/21 1608  INR 1.0   Cardiac Enzymes: No results for input(s): "CKTOTAL", "CKMB", "CKMBINDEX", "TROPONINI" in the last 168 hours. BNP (last 3 results) No results for input(s): "PROBNP" in the last 8760 hours. HbA1C: No results for input(s): "HGBA1C" in the last 72 hours. CBG: No results for input(s): "GLUCAP" in the last 168 hours. Lipid Profile: No results for input(s): "CHOL", "HDL", "LDLCALC", "TRIG", "CHOLHDL", "LDLDIRECT" in the last 72 hours. Thyroid Function  Tests: No results for input(s): "TSH", "T4TOTAL", "FREET4", "T3FREE", "THYROIDAB" in the last 72 hours. Anemia Panel: No results for input(s): "VITAMINB12", "FOLATE", "FERRITIN", "TIBC", "IRON", "RETICCTPCT" in the last 72 hours. Sepsis Labs: No results for input(s): "PROCALCITON", "LATICACIDVEN" in the last 168 hours.  Recent Results (from the past 240 hour(s))  Resp Panel by RT-PCR (Flu A&B, Covid) Anterior Nasal Swab     Status: None   Collection Time: 08/11/21  4:08 PM   Specimen: Anterior Nasal Swab  Result Value Ref Range Status   SARS Coronavirus 2 by RT PCR NEGATIVE NEGATIVE Final    Comment: (NOTE) SARS-CoV-2 target nucleic acids are NOT DETECTED.  The SARS-CoV-2 RNA is generally detectable in upper respiratory specimens during the acute phase of infection. The lowest concentration of SARS-CoV-2 viral copies this assay can detect is 138 copies/mL. A negative result does not preclude SARS-Cov-2 infection and should not be used as the sole basis for treatment or other patient management decisions. A negative result may occur with  improper specimen collection/handling, submission of specimen other than nasopharyngeal swab, presence of viral mutation(s) within the areas targeted by this assay, and inadequate number of viral copies(<138 copies/mL). A negative result must be combined with clinical observations, patient history, and epidemiological information. The expected result is Negative.  Fact Sheet for Patients:  BloggerCourse.com  Fact Sheet for Healthcare Providers:  SeriousBroker.it  This test is no t yet approved or cleared by the [place] FDA and  has been authorized for detection and/or diagnosis of SARS-CoV-2 by FDA under an Emergency Use Authorization (EUA). This EUA will remain  in effect (meaning this test can be used) for the duration of the COVID-19 declaration under Section 564(b)(1) of the Act,  21 U.S.C.section 360bbb-3(b)(1), unless the authorization is terminated  or revoked sooner.       Influenza A by PCR NEGATIVE NEGATIVE Final   Influenza B by PCR NEGATIVE NEGATIVE Final    Comment: (NOTE) The Xpert Xpress SARS-CoV-2/FLU/RSV plus assay is intended as an aid in the diagnosis of influenza from Nasopharyngeal swab specimens and should not be used as a sole basis for treatment. Nasal washings and aspirates are unacceptable for Xpert Xpress SARS-CoV-2/FLU/RSV testing.  Fact Sheet for Patients: BloggerCourse.com  Fact Sheet for Healthcare Providers: SeriousBroker.it  This test is not yet approved or cleared by the [place] FDA and has been authorized for detection and/or diagnosis of SARS-CoV-2 by FDA under an Emergency Use Authorization (EUA). This EUA will remain in effect (meaning this test can be used) for the duration of the COVID-19 declaration under Section 564(b)(1) of the Act, 21 U.S.C. section 360bbb-3(b)(1), unless the authorization is terminated or revoked.  Performed at [facility], [address] [place], [place] [address]   Surgical PCR screen     Status: Abnormal   Collection Time: 08/11/21 10:09 PM   Specimen: Nasal Mucosa; Nasal Swab  Result Value Ref Range Status   MRSA, PCR  NEGATIVE NEGATIVE Final   Staphylococcus aureus POSITIVE (A) NEGATIVE Final    Comment: (NOTE) The Xpert SA Assay (FDA approved for NASAL specimens in patients 86 years of age and older), is one component of a comprehensive surveillance program. It is not intended to diagnose infection nor to guide or monitor treatment. Performed at [facility], [address] [place], [place] [address]      Radiology Studies: CT HEAD WO CONTRAST  Result Date: 08/11/2021 CLINICAL DATA:  Head trauma, moderate-severe; Polytrauma, blunt EXAM: CT HEAD WITHOUT CONTRAST CT CERVICAL SPINE WITHOUT  CONTRAST TECHNIQUE: Multidetector CT imaging of the head and cervical spine was performed following the standard protocol without intravenous contrast. Multiplanar CT image reconstructions of the cervical spine were also generated. RADIATION DOSE REDUCTION: This exam was performed according to the departmental dose-optimization program which includes automated exposure control, adjustment of the mA and/or kV according to patient size and/or use of iterative reconstruction technique. COMPARISON:  01/30/2013, 12/06/2012 FINDINGS: CT HEAD FINDINGS Brain: No evidence of acute infarction, hemorrhage, hydrocephalus, extra-axial collection or mass lesion/mass effect. Extensive low-density changes within the periventricular and subcortical white matter compatible with chronic microvascular ischemic change. Moderate diffuse cerebral volume loss. Vascular: Atherosclerotic calcifications involving the large vessels of the skull base. No unexpected hyperdense vessel. Skull: Normal. Negative for fracture or focal lesion. Sinuses/Orbits: No acute finding. Other: Negative for scalp hematoma. CT CERVICAL SPINE FINDINGS Alignment: Facet joints are aligned without dislocation or traumatic listhesis. Dens and lateral masses are aligned. Skull base and vertebrae: No acute fracture. No primary bone lesion or focal pathologic process. Osseous structures are diffusely demineralized. Soft tissues and spinal canal: No prevertebral fluid or swelling. No visible canal hematoma. Disc levels:  Mild multilevel cervical spondylosis. Upper chest: Included lung apices are clear. Other: Aortic and bilateral carotid atherosclerosis. IMPRESSION: 1. No acute intracranial abnormality. 2. Chronic microvascular ischemic change and moderate diffuse cerebral volume loss. 3. No acute cervical spine fracture or subluxation. 4. Mild multilevel cervical spondylosis. Aortic Atherosclerosis (ICD10-I70.0). Electronically Signed   By: Duanne GuessNicholas  Plundo D.O.   On:  08/11/2021 16:12   CT CERVICAL SPINE WO CONTRAST  Result Date: 08/11/2021 CLINICAL DATA:  Head trauma, moderate-severe; Polytrauma, blunt EXAM: CT HEAD WITHOUT CONTRAST CT CERVICAL SPINE WITHOUT CONTRAST TECHNIQUE: Multidetector CT imaging of the head and cervical spine was performed following the standard protocol without intravenous contrast. Multiplanar CT image reconstructions of the cervical spine were also generated. RADIATION DOSE REDUCTION: This exam was performed according to the departmental dose-optimization program which includes automated exposure control, adjustment of the mA and/or kV according to patient size and/or use of iterative reconstruction technique. COMPARISON:  01/30/2013, 12/06/2012 FINDINGS: CT HEAD FINDINGS Brain: No evidence of acute infarction, hemorrhage, hydrocephalus, extra-axial collection or mass lesion/mass effect. Extensive low-density changes within the periventricular and subcortical white matter compatible with chronic microvascular ischemic change. Moderate diffuse cerebral volume loss. Vascular: Atherosclerotic calcifications involving the large vessels of the skull base. No unexpected hyperdense vessel. Skull: Normal. Negative for fracture or focal lesion. Sinuses/Orbits: No acute finding. Other: Negative for scalp hematoma. CT CERVICAL SPINE FINDINGS Alignment: Facet joints are aligned without dislocation or traumatic listhesis. Dens and lateral masses are aligned. Skull base and vertebrae: No acute fracture. No primary bone lesion or focal pathologic process. Osseous structures are diffusely demineralized. Soft tissues and spinal canal: No prevertebral fluid or swelling. No visible canal hematoma. Disc levels:  Mild multilevel cervical spondylosis. Upper chest: Included lung apices are clear. Other: Aortic  and bilateral carotid atherosclerosis. IMPRESSION: 1. No acute intracranial abnormality. 2. Chronic microvascular ischemic change and moderate diffuse cerebral  volume loss. 3. No acute cervical spine fracture or subluxation. 4. Mild multilevel cervical spondylosis. Aortic Atherosclerosis (ICD10-I70.0). Electronically Signed   By: Duanne Guess D.O.   On: 08/11/2021 16:12   DG Knee Left Port  Result Date: 08/11/2021 CLINICAL DATA:  Fall. EXAM: PORTABLE LEFT KNEE - 1-2 VIEW COMPARISON:  Left knee x-rays dated September 19, 2015. FINDINGS: No acute fracture or dislocation. No joint effusion. Mild tricompartmental degenerative changes. Osteopenia. Soft tissues are unremarkable. IMPRESSION: 1. No acute osseous abnormality. 2. Mild tricompartmental osteoarthritis. Electronically Signed   By: Obie Dredge M.D.   On: 08/11/2021 15:39   DG Chest Port 1 View  Result Date: 08/11/2021 CLINICAL DATA:  Trauma. EXAM: PORTABLE CHEST 1 VIEW COMPARISON:  Chest radiograph 02/26/2013. FINDINGS: Streaky left basilar opacities. No visible pneumothorax or pleural effusions. Biapical pleuroparenchymal scarring. Mild enlargement the cardiac silhouette. No displaced fracture identified. Polyarticular degenerative change. IMPRESSION: Streaky left basilar opacities, which could represent atelectasis (favored), aspiration, and/or pneumonia. Electronically Signed   By: Feliberto Harts M.D.   On: 08/11/2021 15:34   DG Hip Unilat With Pelvis 2-3 Views Left  Result Date: 08/11/2021 CLINICAL DATA:  Fall EXAM: DG HIP (WITH OR WITHOUT PELVIS) 2-3V LEFT COMPARISON:  None Available. FINDINGS: Significantly displaced fracture of the LEFT femoral neck, intertrochanteric region, with at least some degree of impaction at the fracture site and with nearly 90% angulation deformity at the fracture site. Femoral head remains grossly well positioned relative to the acetabulum. Probable old healed fracture of the LEFT symphysis pubis. IMPRESSION: LEFT femoral neck fracture, as detailed above. Electronically Signed   By: Bary Richard M.D.   On: 08/11/2021 15:32     Scheduled Meds:  docusate sodium  100  mg Oral BID   ferrous sulfate  325 mg Oral TID PC   levothyroxine  88 mcg Oral QAC breakfast   mupirocin ointment  1 Application Nasal BID   Continuous Infusions:  sodium chloride 75 mL/hr at 08/11/21 2159   methocarbamol (ROBAXIN) IV      LOS: 1 day   Marguerita Merles, DO [facility] Available via Epic secure chat 7am-7pm After these hours, please refer to coverage provider listed on [email or web address] 08/12/2021, 10:03 AM

## 2021-08-12 NOTE — TOC Initial Note (Signed)
Transition of Care [facility]) - Initial/Assessment Note    Patient Details  Name: Jennifer Gallegos MRN: [id number] Date of Birth: November 29, 1919  [facility]) CM/SW Contact:    Lennart Pall, LCSW Phone Number: 08/12/2021, 3:15 PM  Clinical Narrative:                 Met with pt and two sons today to introduce self/ TOC role with dc planning.  All very engaged and are awaiting surgery planned for tomorrow for hip fx.  Son confirms that pt currently a resident in the [place] apts at [facility] and has remained very active and independent and looking forward to celebrating her 89th birthday in August.  Son adds that she is independent with ambulation and "kind of" uses a cane.  All are aware  CSW will follow for probable SNF rehab needs following surgery.  Will follow up with all once therapy has made their evaluation visits post surgery.  Expected Discharge Plan: Skilled Nursing Facility Barriers to Discharge: Continued Medical Work up   Patient Goals and CMS Choice Patient states their goals for this hospitalization and ongoing recovery are:: [place]      Expected Discharge Plan and Services Expected Discharge Plan: [place] In-house Referral: Clinical Social Work   Post Acute Care Choice: [place] Living arrangements for the past 2 months: [place]                 DME Arranged: N/A DME Agency: NA                  Prior Living Arrangements/Services Living arrangements for the past 2 months: [facility] Lives with:: Self Patient language and need for interpreter reviewed:: Yes Do you feel safe going back to the place where you live?: Yes      Need for Family Participation in Patient Care: No (Comment) Care giver support system in place?: Yes (comment)   Criminal Activity/Legal Involvement Pertinent to Current Situation/Hospitalization: No - Comment as needed  Activities of Daily  Living Home Assistive Devices/Equipment: None ADL Screening (condition at time of admission) Patient's cognitive ability adequate to safely complete daily activities?: Yes Is the patient deaf or have difficulty hearing?: No Does the patient have difficulty seeing, even when wearing glasses/contacts?: No Does the patient have difficulty concentrating, remembering, or making decisions?: No Patient able to express need for assistance with ADLs?: Yes Does the patient have difficulty dressing or bathing?: Yes Independently performs ADLs?: No Communication: Independent Dressing (OT): Needs assistance Is this a change from baseline?: Change from baseline, expected to last <3days Grooming: Needs assistance Is this a change from baseline?: Change from baseline, expected to last <3 days Feeding: Independent Bathing: Needs assistance Is this a change from baseline?: Change from baseline, expected to last <3 days Toileting: Needs assistance Is this a change from baseline?: Change from baseline, expected to last <3 days In/Out Bed: Needs assistance Is this a change from baseline?: Change from baseline, expected to last <3 days Walks in Home: Needs assistance Is this a change from baseline?: Change from baseline, expected to last <3 days Does the patient have difficulty walking or climbing stairs?: Yes Weakness of Legs: Left Weakness of Arms/Hands: Left  Permission Sought/Granted Permission sought to share information with : Family Supports Permission granted to share information with : Yes, Verbal Permission Granted  Share Information with NAME: Brittanie Dosanjh     Permission granted  to share info w Relationship: son  Permission granted to share info w Contact Information: [phone]  Emotional Assessment Appearance:: Appears stated age Attitude/Demeanor/Rapport: Gracious, Engaged Affect (typically observed): Accepting Orientation: : Oriented to Self, Oriented to Place, Oriented to  Time,  Oriented to Situation Alcohol / Substance Use: Not Applicable Psych Involvement: No (comment)  Admission diagnosis:  Femoral neck fracture ([place]) [S72.009A] Fall, initial encounter [W19.XXXA] Closed fracture of neck of left femur, initial encounter ([place]) [S72.002A] Patient Active Problem List   Diagnosis Date Noted   Femoral neck fracture ([place]) 08/11/2021   Primary hypertension 08/11/2021   Hypothyroidism 08/11/2021   Renal insufficiency 08/11/2021   Leukocytosis 08/11/2021   Hyperglycemia 08/11/2021   Upper abdominal pain 02/20/2019   Abnormal findings on diagnostic imaging of other abdominal regions, including retroperitoneum 02/20/2019   PCP:  Kristopher Glee., MD Pharmacy:   [place] [address], [place] [address] Phone: [phone] Fax: [phone]  [place], [place] [address] Phone: [phone] Fax: [phone]     Social Determinants of Health (SDOH) Interventions    Readmission Risk Interventions    08/12/2021    3:14 PM  Readmission Risk Prevention Plan  Post Dischage Appt Complete  Medication Screening Complete  Transportation Screening Complete

## 2021-08-12 NOTE — Progress Notes (Signed)
Subjective: Pt resting comfortably in bed in NAD. Has had nerve block which is functioning well  Objective: Vital signs in last 24 hours: Temp:  [97.5 F (36.4 C)-98.2 F (36.8 C)] 97.5 F (36.4 C) (07/04 0626) Pulse Rate:  [25-110] 85 (07/04 0626) Resp:  [16-47] 17 (07/04 0626) BP: (139-166)/(62-94) 150/73 (07/04 0626) SpO2:  [88 %-100 %] 96 % (07/04 0626) Weight:  [46.9 kg] 46.9 kg (07/03 1952)  Intake/Output from previous day: 07/03 0701 - 07/04 0700 In: 1235 [P.O.:360; I.V.:875] Out: 700 [Urine:700] Intake/Output this shift: No intake/output data recorded.  Recent Labs    08/11/21 1608 08/12/21 0324  HGB 14.2 13.2   Recent Labs    08/11/21 1608 08/12/21 0324  WBC 15.6* 17.6*  RBC 4.63 4.35  HCT 42.8 40.0  PLT 275 242   Recent Labs    08/11/21 1608 08/12/21 0324  NA 141 140  K 3.7 4.1  CL 108 111  CO2 23 22  BUN 32* 28*  CREATININE 1.11* 0.98  GLUCOSE 268* 210*  CALCIUM 8.9 8.7*   Recent Labs    08/11/21 1608  INR 1.0    Neurologically intact Wiggles toes LLE and moves foot without discomfort Pain on any attempted ROM left hip    Assessment/Plan: Left intertrochanteric/basilar femoral neck fracture- Will require operative fixation. Dr Victorino Dike has contacted Dr Linna Caprice who will operate tomorrow for THA vs IM nail for intertrochanteric fracture fixation. NPO after midnight    Jennifer Gallegos 08/12/2021, 9:04 AM

## 2021-08-13 ENCOUNTER — Inpatient Hospital Stay (HOSPITAL_COMMUNITY): Payer: Medicare Other | Admitting: Certified Registered Nurse Anesthetist

## 2021-08-13 ENCOUNTER — Encounter (HOSPITAL_COMMUNITY): Payer: Self-pay | Admitting: Anesthesiology

## 2021-08-13 ENCOUNTER — Encounter (HOSPITAL_COMMUNITY)
Admission: EM | Disposition: A | Discharge status: Skilled Nursing Facility | Payer: Self-pay | Source: Skilled Nursing Facility | Attending: Student

## 2021-08-13 ENCOUNTER — Inpatient Hospital Stay (HOSPITAL_COMMUNITY): Payer: Medicare Other

## 2021-08-13 ENCOUNTER — Other Ambulatory Visit: Payer: Self-pay

## 2021-08-13 ENCOUNTER — Encounter (HOSPITAL_COMMUNITY): Payer: Self-pay | Admitting: Internal Medicine

## 2021-08-13 DIAGNOSIS — N179 Acute kidney failure, unspecified: Secondary | ICD-10-CM

## 2021-08-13 DIAGNOSIS — S72002A Fracture of unspecified part of neck of left femur, initial encounter for closed fracture: Secondary | ICD-10-CM | POA: Diagnosis not present

## 2021-08-13 DIAGNOSIS — E559 Vitamin D deficiency, unspecified: Secondary | ICD-10-CM

## 2021-08-13 DIAGNOSIS — R4189 Other symptoms and signs involving cognitive functions and awareness: Secondary | ICD-10-CM

## 2021-08-13 DIAGNOSIS — Z8739 Personal history of other diseases of the musculoskeletal system and connective tissue: Secondary | ICD-10-CM

## 2021-08-13 DIAGNOSIS — S72102A Unspecified trochanteric fracture of left femur, initial encounter for closed fracture: Secondary | ICD-10-CM

## 2021-08-13 DIAGNOSIS — E039 Hypothyroidism, unspecified: Secondary | ICD-10-CM | POA: Diagnosis not present

## 2021-08-13 DIAGNOSIS — R739 Hyperglycemia, unspecified: Secondary | ICD-10-CM | POA: Diagnosis not present

## 2021-08-13 HISTORY — PX: INTRAMEDULLARY (IM) NAIL INTERTROCHANTERIC: SHX5875

## 2021-08-13 LAB — TSH: TSH: 5.064 u[IU]/mL — ABNORMAL HIGH (ref 0.350–4.500)

## 2021-08-13 LAB — CBC WITH DIFFERENTIAL/PLATELET
Abs Immature Granulocytes: 0.14 10*3/uL — ABNORMAL HIGH (ref 0.00–0.07)
Basophils Absolute: 0.1 10*3/uL (ref 0.0–0.1)
Basophils Relative: 0 %
Eosinophils Absolute: 0.1 10*3/uL (ref 0.0–0.5)
Eosinophils Relative: 1 %
HCT: 39.1 % (ref 36.0–46.0)
Hemoglobin: 12.9 g/dL (ref 12.0–15.0)
Immature Granulocytes: 1 %
Lymphocytes Relative: 9 %
Lymphs Abs: 1.9 10*3/uL (ref 0.7–4.0)
MCH: 30.4 pg (ref 26.0–34.0)
MCHC: 33 g/dL (ref 30.0–36.0)
MCV: 92.2 fL (ref 80.0–100.0)
Monocytes Absolute: 2 10*3/uL — ABNORMAL HIGH (ref 0.1–1.0)
Monocytes Relative: 9 %
Neutro Abs: 17.3 10*3/uL — ABNORMAL HIGH (ref 1.7–7.7)
Neutrophils Relative %: 80 %
Platelets: 250 10*3/uL (ref 150–400)
RBC: 4.24 MIL/uL (ref 3.87–5.11)
RDW: 13.5 % (ref 11.5–15.5)
WBC: 21.6 10*3/uL — ABNORMAL HIGH (ref 4.0–10.5)
nRBC: 0 % (ref 0.0–0.2)

## 2021-08-13 LAB — COMPREHENSIVE METABOLIC PANEL
ALT: 15 U/L (ref 0–44)
AST: 21 U/L (ref 15–41)
Albumin: 3.7 g/dL (ref 3.5–5.0)
Alkaline Phosphatase: 78 U/L (ref 38–126)
Anion gap: 9 (ref 5–15)
BUN: 23 mg/dL (ref 8–23)
CO2: 23 mmol/L (ref 22–32)
Calcium: 9 mg/dL (ref 8.9–10.3)
Chloride: 106 mmol/L (ref 98–111)
Creatinine, Ser: 0.84 mg/dL (ref 0.44–1.00)
GFR, Estimated: >60 mL/min (ref >60)
Glucose, Bld: 144 mg/dL — ABNORMAL HIGH (ref 70–99)
Potassium: 3.5 mmol/L (ref 3.5–5.1)
Sodium: 138 mmol/L (ref 135–145)
Total Bilirubin: 1.1 mg/dL (ref 0.3–1.2)
Total Protein: 6.5 g/dL (ref 6.5–8.1)

## 2021-08-13 LAB — PHOSPHORUS: Phosphorus: 2.4 mg/dL — ABNORMAL LOW (ref 2.5–4.6)

## 2021-08-13 LAB — MAGNESIUM: Magnesium: 2.1 mg/dL (ref 1.7–2.4)

## 2021-08-13 SURGERY — FIXATION, FRACTURE, INTERTROCHANTERIC, WITH INTRAMEDULLARY ROD
Anesthesia: Spinal | Site: Hip | Laterality: Left

## 2021-08-13 SURGERY — ARTHROPLASTY, HIP, TOTAL, ANTERIOR APPROACH
Anesthesia: Choice | Site: Hip | Laterality: Left

## 2021-08-13 MED ORDER — TRANEXAMIC ACID-NACL 1000-0.7 MG/100ML-% IV SOLN
1000.0000 mg | INTRAVENOUS | Status: AC
  Administered 2021-08-13: 1000 mg via INTRAVENOUS
  Filled 2021-08-13: qty 100

## 2021-08-13 MED ORDER — ISOPROPYL ALCOHOL 70 % SOLN
Status: DC | PRN
  Administered 2021-08-13: 1 via TOPICAL

## 2021-08-13 MED ORDER — CHLORHEXIDINE GLUCONATE 4 % EX LIQD: 60.0000 mL | Freq: Once | CUTANEOUS | Status: DC

## 2021-08-13 MED ORDER — POVIDONE-IODINE 10 % EX SWAB: 2.0000 | Freq: Once | CUTANEOUS | Status: DC

## 2021-08-13 MED ORDER — WATER FOR IRRIGATION, STERILE IR SOLN
Status: DC | PRN
  Administered 2021-08-13: 1000 mL

## 2021-08-13 MED ORDER — DOCUSATE SODIUM 100 MG PO CAPS
100.0000 mg | ORAL_CAPSULE | Freq: Two times a day (BID) | ORAL | Status: DC
  Administered 2021-08-14 – 2021-08-15 (×3): 100 mg via ORAL
  Filled 2021-08-13 (×3): qty 1

## 2021-08-13 MED ORDER — EPHEDRINE SULFATE-NACL 50-0.9 MG/10ML-% IV SOSY
PREFILLED_SYRINGE | INTRAVENOUS | Status: DC | PRN
  Administered 2021-08-13 (×2): 10 mg via INTRAVENOUS

## 2021-08-13 MED ORDER — PROPOFOL 1000 MG/100ML IV EMUL
INTRAVENOUS | Status: AC
  Filled 2021-08-13: qty 100

## 2021-08-13 MED ORDER — METOCLOPRAMIDE HCL 5 MG/ML IJ SOLN: 5.0000 mg | Freq: Three times a day (TID) | INTRAMUSCULAR | Status: DC | PRN

## 2021-08-13 MED ORDER — BUPIVACAINE IN DEXTROSE 0.75-8.25 % IT SOLN
INTRATHECAL | Status: DC | PRN
  Administered 2021-08-13: 1.7 mL via INTRATHECAL

## 2021-08-13 MED ORDER — LABETALOL HCL 5 MG/ML IV SOLN: 10.0000 mg | INTRAVENOUS | Status: DC | PRN

## 2021-08-13 MED ORDER — TRANEXAMIC ACID-NACL 1000-0.7 MG/100ML-% IV SOLN: 1000.0000 mg | INTRAVENOUS | Status: DC

## 2021-08-13 MED ORDER — PHENOL 1.4 % MT LIQD: 1.0000 | OROMUCOSAL | Status: DC | PRN

## 2021-08-13 MED ORDER — TRANEXAMIC ACID-NACL 1000-0.7 MG/100ML-% IV SOLN
1000.0000 mg | Freq: Once | INTRAVENOUS | Status: AC
  Administered 2021-08-13: 1000 mg via INTRAVENOUS
  Filled 2021-08-13: qty 100

## 2021-08-13 MED ORDER — METOCLOPRAMIDE HCL 5 MG PO TABS: 5.0000 mg | ORAL_TABLET | Freq: Three times a day (TID) | ORAL | Status: DC | PRN

## 2021-08-13 MED ORDER — CEFAZOLIN SODIUM-DEXTROSE 2-4 GM/100ML-% IV SOLN
2.0000 g | INTRAVENOUS | Status: AC
  Administered 2021-08-13: 2 g via INTRAVENOUS
  Filled 2021-08-13: qty 100

## 2021-08-13 MED ORDER — SODIUM CHLORIDE (PF) 0.9 % IJ SOLN
INTRAMUSCULAR | Status: AC
  Filled 2021-08-13: qty 30

## 2021-08-13 MED ORDER — SENNA 8.6 MG PO TABS
1.0000 | ORAL_TABLET | Freq: Two times a day (BID) | ORAL | Status: DC
  Administered 2021-08-14 – 2021-08-15 (×3): 8.6 mg via ORAL
  Filled 2021-08-13 (×3): qty 1

## 2021-08-13 MED ORDER — ONDANSETRON HCL 4 MG PO TABS: 4.0000 mg | ORAL_TABLET | Freq: Four times a day (QID) | ORAL | Status: DC | PRN

## 2021-08-13 MED ORDER — PROPOFOL 10 MG/ML IV BOLUS
INTRAVENOUS | Status: DC | PRN
  Administered 2021-08-13 (×3): 10 mg via INTRAVENOUS

## 2021-08-13 MED ORDER — DEXAMETHASONE SODIUM PHOSPHATE 10 MG/ML IJ SOLN
INTRAMUSCULAR | Status: DC | PRN
  Administered 2021-08-13: 5 mg via INTRAVENOUS

## 2021-08-13 MED ORDER — TRANEXAMIC ACID-NACL 1000-0.7 MG/100ML-% IV SOLN: 1000.0000 mg | Freq: Once | INTRAVENOUS | Status: DC

## 2021-08-13 MED ORDER — MENTHOL 3 MG MT LOZG: 1.0000 | LOZENGE | OROMUCOSAL | Status: DC | PRN

## 2021-08-13 MED ORDER — PHENYLEPHRINE HCL-NACL 20-0.9 MG/250ML-% IV SOLN
INTRAVENOUS | Status: DC | PRN
  Administered 2021-08-13: 50 ug/min via INTRAVENOUS

## 2021-08-13 MED ORDER — ENOXAPARIN SODIUM 30 MG/0.3ML IJ SOSY
30.0000 mg | PREFILLED_SYRINGE | INTRAMUSCULAR | Status: DC
  Administered 2021-08-14 – 2021-08-15 (×2): 30 mg via SUBCUTANEOUS
  Filled 2021-08-13 (×2): qty 0.3

## 2021-08-13 MED ORDER — LACTATED RINGERS IV SOLN: INTRAVENOUS | Status: DC | PRN

## 2021-08-13 MED ORDER — OXYCODONE HCL 5 MG/5ML PO SOLN: 5.0000 mg | Freq: Once | ORAL | Status: DC | PRN

## 2021-08-13 MED ORDER — ONDANSETRON HCL 4 MG/2ML IJ SOLN
INTRAMUSCULAR | Status: DC | PRN
  Administered 2021-08-13: 4 mg via INTRAVENOUS

## 2021-08-13 MED ORDER — PROPOFOL 500 MG/50ML IV EMUL
INTRAVENOUS | Status: DC | PRN
  Administered 2021-08-13 (×2): 30 ug/kg/min via INTRAVENOUS

## 2021-08-13 MED ORDER — SODIUM CHLORIDE 0.9 % IR SOLN
Status: DC | PRN
  Administered 2021-08-13: 1000 mL

## 2021-08-13 MED ORDER — KETOROLAC TROMETHAMINE 30 MG/ML IJ SOLN
INTRAMUSCULAR | Status: AC
  Filled 2021-08-13: qty 1

## 2021-08-13 MED ORDER — PHENYLEPHRINE 80 MCG/ML (10ML) SYRINGE FOR IV PUSH (FOR BLOOD PRESSURE SUPPORT)
PREFILLED_SYRINGE | INTRAVENOUS | Status: DC | PRN
  Administered 2021-08-13: 160 ug via INTRAVENOUS

## 2021-08-13 MED ORDER — ONDANSETRON HCL 4 MG/2ML IJ SOLN: 4.0000 mg | Freq: Four times a day (QID) | INTRAMUSCULAR | Status: DC | PRN

## 2021-08-13 MED ORDER — CEFAZOLIN SODIUM-DEXTROSE 2-4 GM/100ML-% IV SOLN: 2.0000 g | INTRAVENOUS | Status: DC

## 2021-08-13 MED ORDER — DEXMEDETOMIDINE (PRECEDEX) IN NS 20 MCG/5ML (4 MCG/ML) IV SYRINGE
PREFILLED_SYRINGE | INTRAVENOUS | Status: DC | PRN
  Administered 2021-08-13 (×3): 4 ug via INTRAVENOUS

## 2021-08-13 MED ORDER — OXYCODONE HCL 5 MG PO TABS: 5.0000 mg | ORAL_TABLET | Freq: Once | ORAL | Status: DC | PRN

## 2021-08-13 MED ORDER — ISOPROPYL ALCOHOL 70 % SOLN
Status: AC
  Filled 2021-08-13: qty 480

## 2021-08-13 MED ORDER — CEFAZOLIN SODIUM-DEXTROSE 2-4 GM/100ML-% IV SOLN
2.0000 g | Freq: Four times a day (QID) | INTRAVENOUS | Status: AC
  Administered 2021-08-14 (×2): 2 g via INTRAVENOUS
  Filled 2021-08-13 (×2): qty 100

## 2021-08-13 MED ORDER — PHENYLEPHRINE HCL-NACL 20-0.9 MG/250ML-% IV SOLN
INTRAVENOUS | Status: AC
  Filled 2021-08-13: qty 250

## 2021-08-13 MED ORDER — KETAMINE HCL 50 MG/5ML IJ SOSY
PREFILLED_SYRINGE | INTRAMUSCULAR | Status: AC
  Filled 2021-08-13: qty 5

## 2021-08-13 MED ORDER — KCL IN DEXTROSE-NACL 20-5-0.45 MEQ/L-%-% IV SOLN: INTRAVENOUS | Status: DC

## 2021-08-13 MED ORDER — ACETAMINOPHEN 500 MG PO TABS
1000.0000 mg | ORAL_TABLET | Freq: Once | ORAL | Status: AC
  Administered 2021-08-13: 1000 mg via ORAL
  Filled 2021-08-13: qty 2

## 2021-08-13 MED ORDER — FENTANYL CITRATE PF 50 MCG/ML IJ SOSY: 25.0000 ug | PREFILLED_SYRINGE | INTRAMUSCULAR | Status: DC | PRN

## 2021-08-13 MED ORDER — PROPOFOL 10 MG/ML IV BOLUS
INTRAVENOUS | Status: AC
  Filled 2021-08-13: qty 20

## 2021-08-13 SURGICAL SUPPLY — 53 items
BAG COUNTER SPONGE SURGICOUNT (BAG) IMPLANT
BAG DECANTER FOR FLEXI CONT (MISCELLANEOUS) IMPLANT
BAG ZIPLOCK 12X15 (MISCELLANEOUS) IMPLANT
CHLORAPREP W/TINT 26 (MISCELLANEOUS) ×2 IMPLANT
COVER PERINEAL POST (MISCELLANEOUS) ×2 IMPLANT
COVER SURGICAL LIGHT HANDLE (MISCELLANEOUS) ×2 IMPLANT
DERMABOND ADVANCED (GAUZE/BANDAGES/DRESSINGS) ×2
DERMABOND ADVANCED .7 DNX12 (GAUZE/BANDAGES/DRESSINGS) ×2 IMPLANT
DRAPE IMP U-DRAPE 54X76 (DRAPES) ×2 IMPLANT
DRAPE SHEET LG 3/4 BI-LAMINATE (DRAPES) ×6 IMPLANT
DRAPE STERI IOBAN 125X83 (DRAPES) ×2 IMPLANT
DRAPE U-SHAPE 47X51 STRL (DRAPES) ×4 IMPLANT
DRSG AQUACEL AG ADV 3.5X10 (GAUZE/BANDAGES/DRESSINGS) ×2 IMPLANT
ELECT REM PT RETURN 15FT ADLT (MISCELLANEOUS) ×2 IMPLANT
GAUZE SPONGE 4X4 12PLY STRL (GAUZE/BANDAGES/DRESSINGS) ×2 IMPLANT
GLOVE BIO SURGEON STRL SZ8.5 (GLOVE) ×4 IMPLANT
GLOVE BIOGEL M 7.0 STRL (GLOVE) ×2 IMPLANT
GLOVE BIOGEL PI IND STRL 7.5 (GLOVE) ×1 IMPLANT
GLOVE BIOGEL PI IND STRL 8.5 (GLOVE) ×1 IMPLANT
GLOVE BIOGEL PI INDICATOR 7.5 (GLOVE) ×1
GLOVE BIOGEL PI INDICATOR 8.5 (GLOVE) ×1
GLOVE SURG LX 7.5 STRW (GLOVE) ×2
GLOVE SURG LX STRL 7.5 STRW (GLOVE) ×2 IMPLANT
GOWN SPEC L3 XXLG W/TWL (GOWN DISPOSABLE) ×2 IMPLANT
GOWN STRL REUS W/ TWL XL LVL3 (GOWN DISPOSABLE) ×1 IMPLANT
GOWN STRL REUS W/TWL XL LVL3 (GOWN DISPOSABLE) ×1
HANDPIECE INTERPULSE COAX TIP (DISPOSABLE) ×1
HOLDER FOLEY CATH W/STRAP (MISCELLANEOUS) ×2 IMPLANT
HOOD PEEL AWAY FLYTE STAYCOOL (MISCELLANEOUS) ×6 IMPLANT
KIT TURNOVER KIT A (KITS) IMPLANT
MANIFOLD NEPTUNE II (INSTRUMENTS) ×2 IMPLANT
MARKER SKIN DUAL TIP RULER LAB (MISCELLANEOUS) ×2 IMPLANT
NDL SAFETY ECLIPSE 18X1.5 (NEEDLE) ×1 IMPLANT
NEEDLE HYPO 18GX1.5 SHARP (NEEDLE) ×1
NEEDLE SPNL 18GX3.5 QUINCKE PK (NEEDLE) ×2 IMPLANT
PACK ANTERIOR HIP CUSTOM (KITS) ×2 IMPLANT
PENCIL SMOKE EVACUATOR (MISCELLANEOUS) IMPLANT
SAW OSC TIP CART 19.5X105X1.3 (SAW) ×2 IMPLANT
SEALER BIPOLAR AQUA 6.0 (INSTRUMENTS) ×2 IMPLANT
SET HNDPC FAN SPRY TIP SCT (DISPOSABLE) ×1 IMPLANT
SOLUTION PRONTOSAN WOUND 350ML (IRRIGATION / IRRIGATOR) ×2 IMPLANT
SPIKE FLUID TRANSFER (MISCELLANEOUS) ×2 IMPLANT
SUT MNCRL AB 3-0 PS2 18 (SUTURE) ×2 IMPLANT
SUT MON AB 2-0 CT1 36 (SUTURE) ×2 IMPLANT
SUT STRATAFIX PDO 1 14 VIOLET (SUTURE) ×1
SUT STRATFX PDO 1 14 VIOLET (SUTURE) ×1
SUT VIC AB 2-0 CT1 27 (SUTURE)
SUT VIC AB 2-0 CT1 TAPERPNT 27 (SUTURE) IMPLANT
SUTURE STRATFX PDO 1 14 VIOLET (SUTURE) ×1 IMPLANT
SYR 3ML LL SCALE MARK (SYRINGE) ×2 IMPLANT
TRAY FOLEY MTR SLVR 16FR STAT (SET/KITS/TRAYS/PACK) IMPLANT
TUBE SUCTION HIGH CAP CLEAR NV (SUCTIONS) ×2 IMPLANT
WATER STERILE IRR 1000ML POUR (IV SOLUTION) ×2 IMPLANT

## 2021-08-13 SURGICAL SUPPLY — 78 items
BAG COUNTER SPONGE SURGICOUNT (BAG) IMPLANT
BAG DECANTER FOR FLEXI CONT (MISCELLANEOUS) IMPLANT
BAG ZIPLOCK 12X15 (MISCELLANEOUS) IMPLANT
BIT DRILL CANN LG 4.3MM (BIT) IMPLANT
BIT DRILL LAG SCREW (DRILL) IMPLANT
CHLORAPREP W/TINT 26 (MISCELLANEOUS) ×4 IMPLANT
COVER PERINEAL POST (MISCELLANEOUS) ×4 IMPLANT
COVER SURGICAL LIGHT HANDLE (MISCELLANEOUS) ×4 IMPLANT
DERMABOND ADVANCED (GAUZE/BANDAGES/DRESSINGS) ×1
DERMABOND ADVANCED .7 DNX12 (GAUZE/BANDAGES/DRESSINGS) ×4 IMPLANT
DRAPE C-ARM 42X120 X-RAY (DRAPES) ×2 IMPLANT
DRAPE C-ARMOR (DRAPES) ×2 IMPLANT
DRAPE IMP U-DRAPE 54X76 (DRAPES) ×6 IMPLANT
DRAPE SHEET LG 3/4 BI-LAMINATE (DRAPES) ×10 IMPLANT
DRAPE STERI IOBAN 125X83 (DRAPES) ×4 IMPLANT
DRAPE U-SHAPE 47X51 STRL (DRAPES) ×8 IMPLANT
DRESSING MEPILEX FLEX 4X4 (GAUZE/BANDAGES/DRESSINGS) ×2 IMPLANT
DRILL BIT CANN LG 4.3MM (BIT) ×2
DRILL LAG SCREW (DRILL) ×2
DRSG AQUACEL AG ADV 3.5X10 (GAUZE/BANDAGES/DRESSINGS) ×2 IMPLANT
DRSG MEPILEX BORDER 4X8 (GAUZE/BANDAGES/DRESSINGS) ×1 IMPLANT
DRSG MEPILEX FLEX 4X4 (GAUZE/BANDAGES/DRESSINGS) ×4
ELECT BLADE TIP CTD 4 INCH (ELECTRODE) IMPLANT
ELECT REM PT RETURN 15FT ADLT (MISCELLANEOUS) ×2 IMPLANT
FACESHIELD WRAPAROUND (MASK) ×4 IMPLANT
FACESHIELD WRAPAROUND OR TEAM (MASK) ×2 IMPLANT
GAUZE SPONGE 4X4 12PLY STRL (GAUZE/BANDAGES/DRESSINGS) ×4 IMPLANT
GLOVE BIO SURGEON STRL SZ8.5 (GLOVE) ×8 IMPLANT
GLOVE BIOGEL M 7.0 STRL (GLOVE) ×4 IMPLANT
GLOVE BIOGEL PI IND STRL 7.5 (GLOVE) ×2 IMPLANT
GLOVE BIOGEL PI IND STRL 8 (GLOVE) ×1 IMPLANT
GLOVE BIOGEL PI IND STRL 8.5 (GLOVE) ×2 IMPLANT
GLOVE BIOGEL PI INDICATOR 7.5 (GLOVE) ×2
GLOVE BIOGEL PI INDICATOR 8 (GLOVE) ×1
GLOVE BIOGEL PI INDICATOR 8.5 (GLOVE) ×2
GLOVE SURG LX 7.5 STRW (GLOVE) ×4
GLOVE SURG LX STRL 7.5 STRW (GLOVE) ×4 IMPLANT
GOWN SPEC L3 XXLG W/TWL (GOWN DISPOSABLE) ×4 IMPLANT
GOWN STRL REUS W/ TWL XL LVL3 (GOWN DISPOSABLE) ×1 IMPLANT
GOWN STRL REUS W/TWL XL LVL3 (GOWN DISPOSABLE) ×1
GUIDEPIN VERSANAIL DSP 3.2X444 (ORTHOPEDIC DISPOSABLE SUPPLIES) ×1 IMPLANT
HANDPIECE INTERPULSE COAX TIP (DISPOSABLE) ×1
HFN 125 DEG 9MM X 180MM (Nail) ×1 IMPLANT
HFN A/R SCREW 65MM (Screw) ×1 IMPLANT
HOLDER FOLEY CATH W/STRAP (MISCELLANEOUS) ×2 IMPLANT
HOOD PEEL AWAY FLYTE STAYCOOL (MISCELLANEOUS) ×6 IMPLANT
KIT BASIN OR (CUSTOM PROCEDURE TRAY) ×2 IMPLANT
KIT TURNOVER KIT A (KITS) IMPLANT
MANIFOLD NEPTUNE II (INSTRUMENTS) ×4 IMPLANT
MARKER SKIN DUAL TIP RULER LAB (MISCELLANEOUS) ×4 IMPLANT
NDL SAFETY ECLIPSE 18X1.5 (NEEDLE) ×1 IMPLANT
NDL SPNL 18GX3.5 QUINCKE PK (NEEDLE) ×1 IMPLANT
NEEDLE HYPO 18GX1.5 SHARP (NEEDLE) ×1
NEEDLE SPNL 18GX3.5 QUINCKE PK (NEEDLE) ×2 IMPLANT
PACK ANTERIOR HIP CUSTOM (KITS) ×2 IMPLANT
PACK GENERAL/GYN (CUSTOM PROCEDURE TRAY) ×2 IMPLANT
PENCIL SMOKE EVACUATOR (MISCELLANEOUS) IMPLANT
SAW OSC TIP CART 19.5X105X1.3 (SAW) ×2 IMPLANT
SCREW BONE CORTICAL 5.0X32 (Screw) ×1 IMPLANT
SCREW LAG HIP NAIL 10.5X95 (Screw) ×1 IMPLANT
SEALER BIPOLAR AQUA 6.0 (INSTRUMENTS) ×2 IMPLANT
SET HNDPC FAN SPRY TIP SCT (DISPOSABLE) ×1 IMPLANT
SOLUTION PRONTOSAN WOUND 350ML (IRRIGATION / IRRIGATOR) ×2 IMPLANT
SPIKE FLUID TRANSFER (MISCELLANEOUS) ×2 IMPLANT
STAPLER VISISTAT 35W (STAPLE) ×1 IMPLANT
SUT MNCRL AB 3-0 PS2 18 (SUTURE) ×4 IMPLANT
SUT MON AB 2-0 CT1 36 (SUTURE) ×2 IMPLANT
SUT STRATAFIX PDO 1 14 VIOLET (SUTURE) ×1
SUT STRATFX PDO 1 14 VIOLET (SUTURE) ×1
SUT VIC AB 1 CT1 36 (SUTURE) ×2 IMPLANT
SUT VIC AB 2-0 CT1 27 (SUTURE)
SUT VIC AB 2-0 CT1 TAPERPNT 27 (SUTURE) IMPLANT
SUTURE STRATFX PDO 1 14 VIOLET (SUTURE) ×1 IMPLANT
SYR 3ML LL SCALE MARK (SYRINGE) ×2 IMPLANT
TOWEL OR 17X26 10 PK STRL BLUE (TOWEL DISPOSABLE) ×2 IMPLANT
TRAY FOLEY MTR SLVR 16FR STAT (SET/KITS/TRAYS/PACK) IMPLANT
TUBE SUCTION HIGH CAP CLEAR NV (SUCTIONS) ×2 IMPLANT
WATER STERILE IRR 1000ML POUR (IV SOLUTION) ×2 IMPLANT

## 2021-08-13 NOTE — Op Note (Signed)
OPERATIVE REPORT  SURGEON: Samson Frederic, MD   ASSISTANT: Clint Bolder, PA-C.  PREOPERATIVE DIAGNOSIS: Comminuted Left basicervical femoral neck fracture / peritrochanteric femur fracture.   POSTOPERATIVE DIAGNOSIS: Comminuted Left basicervical femoral neck fracture / peritrochanteric femur fracture.   PROCEDURE: Intramedullary fixation, Left femur.   IMPLANTS: Biomet Affixus Hip Fracture Nail, 9 by 180 mm, 125 degrees. 10.5 x 95 mm Hip Fracture Nail Lag Screw. 65 mm anti-rotation screw. 5 x 32 mm distal interlocking screw 1.  ANESTHESIA:  Regional and Spinal  ESTIMATED BLOOD LOSS:-100 mL    ANTIBIOTICS:  2 g Ancef.  DRAINS: None.  COMPLICATIONS: None.   CONDITION: PACU - hemodynamically stable.Marland Kitchen   BRIEF CLINICAL NOTE: Jennifer Gallegos is a 88 y.o. female who presented with an intertrochanteric femur fracture. The patient was admitted to the hospitalist service and underwent perioperative risk stratification and medical optimization. The risks, benefits, and alternatives to the procedure were explained, and the patient elected to proceed.  PROCEDURE IN DETAIL: Surgical site was marked by myself. The patient was taken to the operating room and anesthesia was induced on the bed. The patient was then transferred to the [facility] table and the nonoperative lower extremity was scissored underneath the operative side. The fracture was reduced with traction, internal rotation, and adduction. The hip was prepped and draped in the normal sterile surgical fashion. Timeout was called verifying side and site of surgery. Preop antibiotics were given with 60 minutes of beginning the procedure.  Fluoroscopy was used to define the patient's anatomy. A 2 cm incision was made over the anterior proximal femur. The IT band was split, and a cobb was placed to correct translation of the femoral neck. A 4 cm incision was made just proximal to the tip of the greater trochanter. The awl was used to obtain the standard  starting point for a trochanteric entry nail under fluoroscopic control. The guidepin was placed. The entry reamer was used to open the proximal femur.  On the back table, the nail was assembled onto the jig. The nail was placed into the femur without any difficulty. Through a separate stab incision, the cannula was placed down to the bone in preparation for the cephalomedullary device. A guidepin was placed into the femoral head using AP and lateral fluoroscopy views. The pin was measured, and then reaming was performed to the appropriate depth. A second pin was placed to control rotation. The lag screw was inserted to the appropriate depth. The fracture was compressed through the jig. The anti-rotation screw was placed. The setscrew was tightened and then loosened one quarter turn. A separate stab incision was created, and the distal interlocking screw was placed using standard AO technique. The jig was removed. Final AP and lateral fluoroscopy views were obtained to confirm fracture reduction and hardware placement. Tip apex distance was appropriate. There was no chondral penetration.  The wounds were copiously irrigated with saline. The wound was closed in layers with #1 Vicryl for the fascia, 2-0 Monocryl for the deep dermal layer, and skin staples. Glue was applied to the skin. Once the glue was fully hardened, sterile dressing was applied. The patient was then awakened from anesthesia and taken to the PACU in stable condition. Sponge needle and instrument counts were correct at the end of the case 2. There were no known complications.  We will readmit the patient to the hospitalist. Weightbearing status will be weightbearing as tolerated with a walker. We will begin Lovenox for DVT prophylaxis. The patient will mobilize  out of bed with physical therapy and undergo disposition planning.  Please note that a surgical assistant was a medical necessity for this procedure to perform it in a safe and  expeditious manner. Assistant was necessary to maintain the reduction, to prevent femoral fracture, and to allow for anatomic placement of the prosthesis.

## 2021-08-13 NOTE — Discharge Instructions (Signed)
 Dr. Deral Schellenberg Adult Hip & Knee Specialist [facility] [address] [place], [place] [address] [phone]   POSTOPERATIVE DIRECTIONS    Hip Rehabilitation, Guidelines Following Surgery   WEIGHT BEARING Weight bearing as tolerated with assist device (walker, cane, etc) as directed, use it as long as suggested by your surgeon or therapist, typically at least 4-6 weeks.   HOME CARE INSTRUCTIONS  Remove items at home which could result in a fall. This includes throw rugs or furniture in walking pathways.  Continue medications as instructed at time of discharge.  You may have some home medications which will be placed on hold until you complete the course of blood thinner medication.  4 days after discharge, you may start showering. No tub baths or soaking your incisions. Do not put on socks or shoes without following the instructions of your caregivers.   Sit on chairs with arms. Use the chair arms to help push yourself up when arising.  Arrange for the use of a toilet seat elevator so you are not sitting low.   Walk with walker as instructed.  You may resume a sexual relationship in one month or when given the OK by your caregiver.  Use walker as long as suggested by your caregivers.  Avoid periods of inactivity such as sitting longer than an hour when not asleep. This helps prevent blood clots.  You may return to work once you are cleared by your surgeon.  Do not drive a car for 6 weeks or until released by your surgeon.  Do not drive while taking narcotics.  Wear elastic stockings for two weeks following surgery during the day but you may remove then at night.  Make sure you keep all of your appointments after your operation with all of your doctors and caregivers. You should call the office at the above phone number and make an appointment for approximately two weeks after the date of your surgery. Please pick up a stool softener and laxative  for home use as long as you are requiring pain medications.  ICE to the affected hip every three hours for 30 minutes at a time and then as needed for pain and swelling. Continue to use ice on the hip for pain and swelling from surgery. You may notice swelling that will progress down to the foot and ankle.  This is normal after surgery.  Elevate the leg when you are not up walking on it.   It is important for you to complete the blood thinner medication as prescribed by your doctor.  Continue to use the breathing machine which will help keep your temperature down.  It is common for your temperature to cycle up and down following surgery, especially at night when you are not up moving around and exerting yourself.  The breathing machine keeps your lungs expanded and your temperature down.  RANGE OF MOTION AND STRENGTHENING EXERCISES  These exercises are designed to help you keep full movement of your hip joint. Follow your caregiver's or physical therapist's instructions. Perform all exercises about fifteen times, three times per day or as directed. Exercise both hips, even if you have had only one joint replacement. These exercises can be done on a training (exercise) mat, on the floor, on a table or on a bed. Use whatever works the best and is most comfortable for you. Use music or television while you are exercising so that the exercises are a pleasant break in your day. This   will make your life better with the exercises acting as a break in routine you can look forward to.  Lying on your back, slowly slide your foot toward your buttocks, raising your knee up off the floor. Then slowly slide your foot back down until your leg is straight again.  Lying on your back spread your legs as far apart as you can without causing discomfort.  Lying on your side, raise your upper leg and foot straight up from the floor as far as is comfortable. Slowly lower the leg and repeat.  Lying on your back, tighten up the  muscle in the front of your thigh (quadriceps muscles). You can do this by keeping your leg straight and trying to raise your heel off the floor. This helps strengthen the largest muscle supporting your knee.  Lying on your back, tighten up the muscles of your buttocks both with the legs straight and with the knee bent at a comfortable angle while keeping your heel on the floor.   SKILLED REHAB INSTRUCTIONS: If the patient is transferred to a skilled rehab facility following release from the hospital, a list of the current medications will be sent to the facility for the patient to continue.  When discharged from the skilled rehab facility, please have the facility set up the patient's Home Health Physical Therapy prior to being released. Also, the skilled facility will be responsible for providing the patient with their medications at time of release from the facility to include their pain medication and their blood thinner medication. If the patient is still at the rehab facility at time of the two week follow up appointment, the skilled rehab facility will also need to assist the patient in arranging follow up appointment in our office and any transportation needs.  MAKE SURE YOU:  Understand these instructions.  Will watch your condition.  Will get help right away if you are not doing well or get worse.  Pick up stool softner and laxative for home use following surgery while on pain medications. Daily dry dressing changes as needed. In 4 days, you may remove your dressings and begin taking showers - no tub baths or soaking the incisions. Continue to use ice for pain and swelling after surgery. Do not use any lotions or creams on the incision until instructed by your surgeon.   

## 2021-08-13 NOTE — Plan of Care (Signed)
  Problem: Education: Goal: Knowledge of General Education information will improve Description: Including pain rating scale, medication(s)/side effects and non-pharmacologic comfort measures Outcome: Progressing   Problem: Activity: Goal: Risk for activity intolerance will decrease Outcome: Progressing   Problem: Pain Managment: Goal: General experience of comfort will improve Outcome: Progressing   

## 2021-08-13 NOTE — Interval H&P Note (Signed)
History and Physical Interval Note:  08/13/2021 4:26 PM  Jennifer Gallegos  has presented today for surgery, with the diagnosis of left femoral neck fx.  The various methods of treatment have been discussed with the patient and family. After consideration of risks, benefits and other options for treatment, the patient has consented to  Procedure(s): INTRAMEDULLARY (IM) NAIL INTERTROCHANTRIC (Left) as a surgical intervention.  The patient's history has been reviewed, patient examined, no change in status, stable for surgery.  I have reviewed the patient's chart and labs.  Questions were answered to the patient's satisfaction.    CT and x-rays show comminuted L peritroch femur / basicervical femoral neck fracture. Fx requires IM nail stabilization. Discussed with patient and family. If the femoral neck fx component does not heal, she would require conversion to THA as long as the IT fx fomponent has healed. They understand and wish to proceed.  Iline Oven [place] Lore

## 2021-08-13 NOTE — Progress Notes (Signed)
PROGRESS NOTE  Jennifer Gallegos SWN:[id number] DOB: 07-Jun-1919   PCP: Andreas Blower., MD  Patient is from: ALF/ALF  DOA: 08/11/2021 LOS: 2  Chief complaints Chief Complaint  Patient presents with   Fall     Brief Narrative / Interim history: 88 year old F with PMH of HTN, hypothyroidism and bladder pain presenting with left hip pain after she stepped off the curb  and fell, and admitted for left femoral neck fracture.  Orthopedic surgery consulted.  Plan for surgical repair on 7/5.  Subjective: Seen and examined earlier this morning.  No major events overnight of this morning.  Patient is confused and only oriented to self.  She seems to be hallucinating although redirectable.  Objective: Vitals:   08/13/21 0547 08/13/21 0926 08/13/21 1422 08/13/21 1526  BP: (!) 150/78 (!) 140/108 (!) 156/91 (!) 167/86  Pulse: 97 (!) 110 (!) 105 (!) 109  Resp: 16 18 20  (!) 22  Temp: 98.2 F (36.8 C) 98.2 F (36.8 C) 98.3 F (36.8 C) 98.1 F (36.7 C)  TempSrc: Oral Oral Oral Oral  SpO2: 93% 95% 96% 95%  Weight:    46.9 kg  Height:    5' (1.524 m)    Examination:  GENERAL: No apparent distress.  Nontoxic. HEENT: MMM.  Vision and hearing grossly intact.  NECK: Supple.  No apparent JVD.  RESP:  No IWOB.  Fair aeration bilaterally. CVS:  RRR. Heart sounds normal.  ABD/GI/GU: BS+. Abd soft, NTND.  MSK/EXT:  Moves extremities. No apparent deformity. No edema.  SKIN: no apparent skin lesion or wound NEURO: Awake and alert.  Oriented to self.  Follows commands.  No apparent focal neuro deficit. PSYCH: Calm.  Seem to be hallucinating  Procedures:  None  Microbiology summarized: COVID-19 and influenza PCR nonreactive.  Assessment and plan: Principal Problem:   Femoral neck fracture (HCC) Active Problems:   Primary hypertension   Hypothyroidism   Renal insufficiency   Leukocytosis   Hyperglycemia   Cognitive impairment   Accidental fall-history of gait abnormality Left femoral  neck fracture due to accidental fall and osteoporosis Vitamin D deficiency History of osteoporosis -CT head/cervical spine without acute finding. -Plan for surgical repair today -Pain control -Vitamin D supplementation once she started taking p.o.  Cognitive impairment?  She is awake and alert but only oriented to self.  Seems to have some visual hallucination but not agitated. -Reorientation and delirium precautions.  Essential hypertension: BP elevated.  Due to pain?  Home Lotrel is on hold. -Labetalol as needed  Hypothyroidism -Continue home Synthroid  History of bladder pain?  Does not seem to be on medication.  AKI: Resolved.  CKD ruled out. Recent Labs    08/11/21 1608 08/12/21 0324 08/13/21 0347  BUN 32* 28* 23  CREATININE 1.11* 0.98 0.84   Leukocytosis/bandemia: Likely demargination.  Goal of care counseling: DNR/DNI appropriate. -Palliative medicine on board   Body mass index is 20.19 kg/m.  Increased nutrient needs Nutrition Problem: Increased nutrient needs Etiology: acute illness Signs/Symptoms: estimated needs Interventions: Ensure Enlive (each supplement provides 350kcal and 20 grams of protein), MVI, Liberalize Diet   DVT prophylaxis:  SCDs Start: 08/11/21 1818  Code Status: DNR/DNI Family Communication: None at bedside Level of care: Telemetry Status is: Inpatient Remains inpatient appropriate because: Left femoral neck fracture   Final disposition: TBD Consultants:  Orthopedic surgery Palliative medicine  Sch Meds:  Scheduled Meds:  chlorhexidine  60 mL Topical Once   docusate sodium  100 mg Oral BID  feeding supplement  237 mL Oral BID BM   ferrous sulfate  325 mg Oral TID PC   levothyroxine  88 mcg Oral QAC breakfast   multivitamin with minerals  1 tablet Oral Daily   mupirocin ointment  1 Application Nasal BID   povidone-iodine  2 Application Topical Once   povidone-iodine  2 Application Topical Once   Continuous Infusions:   sodium chloride 75 mL/hr at 08/12/21 1231    ceFAZolin (ANCEF) IV     methocarbamol (ROBAXIN) IV     tranexamic acid     PRN Meds:.bisacodyl, HYDROcodone-acetaminophen, labetalol, methocarbamol **OR** methocarbamol (ROBAXIN) IV, morphine injection, polyethylene glycol, sodium phosphate  Antimicrobials: Anti-infectives (From admission, onward)    Start     Dose/Rate Route Frequency Ordered Stop   08/13/21 1530  ceFAZolin (ANCEF) IVPB 2g/100 mL premix        2 g 200 mL/hr over 30 Minutes Intravenous On call to O.R. 08/13/21 1518 08/14/21 0559        I have personally reviewed the following labs and images: CBC: Recent Labs  Lab 08/11/21 1608 08/12/21 0324 08/13/21 0347  WBC 15.6* 17.6* 21.6*  NEUTROABS 13.0*  --  17.3*  HGB 14.2 13.2 12.9  HCT 42.8 40.0 39.1  MCV 92.4 92.0 92.2  PLT 275 242 250   BMP &GFR Recent Labs  Lab 08/11/21 1608 08/12/21 0324 08/13/21 0347  NA 141 140 138  K 3.7 4.1 3.5  CL 108 111 106  CO2 23 22 23   GLUCOSE 268* 210* 144*  BUN 32* 28* 23  CREATININE 1.11* 0.98 0.84  CALCIUM 8.9 8.7* 9.0  MG  --   --  2.1  PHOS  --   --  2.4*   Estimated Creatinine Clearance: 26.2 mL/min (by C-G formula based on SCr of 0.84 mg/dL). Liver & Pancreas: Recent Labs  Lab 08/13/21 0347  AST 21  ALT 15  ALKPHOS 78  BILITOT 1.1  PROT 6.5  ALBUMIN 3.7   No results for input(s): "LIPASE", "AMYLASE" in the last 168 hours. No results for input(s): "AMMONIA" in the last 168 hours. Diabetic: No results for input(s): "HGBA1C" in the last 72 hours. No results for input(s): "GLUCAP" in the last 168 hours. Cardiac Enzymes: No results for input(s): "CKTOTAL", "CKMB", "CKMBINDEX", "TROPONINI" in the last 168 hours. No results for input(s): "PROBNP" in the last 8760 hours. Coagulation Profile: Recent Labs  Lab 08/11/21 1608  INR 1.0   Thyroid Function Tests: Recent Labs    08/13/21 0347  TSH 5.064*   Lipid Profile: No results for input(s): "CHOL",  "HDL", "LDLCALC", "TRIG", "CHOLHDL", "LDLDIRECT" in the last 72 hours. Anemia Panel: No results for input(s): "VITAMINB12", "FOLATE", "FERRITIN", "TIBC", "IRON", "RETICCTPCT" in the last 72 hours. Urine analysis:    Component Value Date/Time   COLORURINE YELLOW (A) 08/11/2021 2209   APPEARANCEUR CLOUDY (A) 08/11/2021 2209   LABSPEC 1.014 08/11/2021 2209   PHURINE 5.0 08/11/2021 2209   GLUCOSEU >=500 (A) 08/11/2021 2209   HGBUR SMALL (A) 08/11/2021 2209   BILIRUBINUR NEGATIVE 08/11/2021 2209   KETONESUR NEGATIVE 08/11/2021 2209   PROTEINUR 30 (A) 08/11/2021 2209   NITRITE NEGATIVE 08/11/2021 2209   LEUKOCYTESUR SMALL (A) 08/11/2021 2209   Sepsis Labs: Invalid input(s): "PROCALCITONIN", "LACTICIDVEN"  Microbiology: Recent Results (from the past 240 hour(s))  Resp Panel by RT-PCR (Flu A&B, Covid) Anterior Nasal Swab     Status: None   Collection Time: 08/11/21  4:08 PM   Specimen: Anterior Nasal Swab  Result Value Ref Range Status   SARS Coronavirus 2 by RT PCR NEGATIVE NEGATIVE Final    Comment: (NOTE) SARS-CoV-2 target nucleic acids are NOT DETECTED.  The SARS-CoV-2 RNA is generally detectable in upper respiratory specimens during the acute phase of infection. The lowest concentration of SARS-CoV-2 viral copies this assay can detect is 138 copies/mL. A negative result does not preclude SARS-Cov-2 infection and should not be used as the sole basis for treatment or other patient management decisions. A negative result may occur with  improper specimen collection/handling, submission of specimen other than nasopharyngeal swab, presence of viral mutation(s) within the areas targeted by this assay, and inadequate number of viral copies(<138 copies/mL). A negative result must be combined with clinical observations, patient history, and epidemiological information. The expected result is Negative.  Fact Sheet for Patients:  BloggerCourse.com  Fact Sheet  for Healthcare Providers:  SeriousBroker.it  This test is no t yet approved or cleared by the [place] FDA and  has been authorized for detection and/or diagnosis of SARS-CoV-2 by FDA under an Emergency Use Authorization (EUA). This EUA will remain  in effect (meaning this test can be used) for the duration of the COVID-19 declaration under Section 564(b)(1) of the Act, 21 U.S.C.section 360bbb-3(b)(1), unless the authorization is terminated  or revoked sooner.       Influenza A by PCR NEGATIVE NEGATIVE Final   Influenza B by PCR NEGATIVE NEGATIVE Final    Comment: (NOTE) The Xpert Xpress SARS-CoV-2/FLU/RSV plus assay is intended as an aid in the diagnosis of influenza from Nasopharyngeal swab specimens and should not be used as a sole basis for treatment. Nasal washings and aspirates are unacceptable for Xpert Xpress SARS-CoV-2/FLU/RSV testing.  Fact Sheet for Patients: BloggerCourse.com  Fact Sheet for Healthcare Providers: SeriousBroker.it  This test is not yet approved or cleared by the [place] FDA and has been authorized for detection and/or diagnosis of SARS-CoV-2 by FDA under an Emergency Use Authorization (EUA). This EUA will remain in effect (meaning this test can be used) for the duration of the COVID-19 declaration under Section 564(b)(1) of the Act, 21 U.S.C. section 360bbb-3(b)(1), unless the authorization is terminated or revoked.  Performed at [facility], [address], [place], [place] [address]   Surgical PCR screen     Status: Abnormal   Collection Time: 08/11/21 10:09 PM   Specimen: Nasal Mucosa; Nasal Swab  Result Value Ref Range Status   MRSA, PCR NEGATIVE NEGATIVE Final   Staphylococcus aureus POSITIVE (A) NEGATIVE Final    Comment: (NOTE) The Xpert SA Assay (FDA approved for NASAL specimens in patients 5 years of age and older), is one  component of a comprehensive surveillance program. It is not intended to diagnose infection nor to guide or monitor treatment. Performed at [facility], [address], [place], [place] [address]     Radiology Studies: No results found.    Murel Shenberger T. Anishka Bushard [facility]  If 7PM-7AM, please contact night-coverage [email or web address] 08/13/2021, 4:24 PM

## 2021-08-13 NOTE — Transfer of Care (Signed)
Immediate Anesthesia Transfer of Care Note  Patient: Jennifer Gallegos  Procedure(s) Performed: INTRAMEDULLARY (IM) NAIL INTERTROCHANTRIC (Left: Hip)  Patient Location: PACU  Anesthesia Type:Spinal  Level of Consciousness: sedated  Airway & Oxygen Therapy: Patient Spontanous Breathing and Patient connected to face mask oxygen  Post-op Assessment: Report given to RN and Post -op Vital signs reviewed and stable  Post vital signs: Reviewed and stable  Last Vitals:  Vitals Value Taken Time  BP 146/129 08/13/21 1823  Temp    Pulse 90 08/13/21 1824  Resp 20 08/13/21 1824  SpO2 100 % 08/13/21 1824  Vitals shown include unvalidated device data.  Last Pain:  Vitals:   08/13/21 1526  TempSrc: Oral  PainSc:       Patients Stated Pain Goal: 0 (08/12/21 1925)  Complications: No notable events documented.

## 2021-08-13 NOTE — Anesthesia Preprocedure Evaluation (Addendum)
Anesthesia Evaluation  Patient identified by MRN, date of birth, ID band Patient awake    Reviewed: Allergy & Precautions, NPO status , Patient's Chart, lab work & pertinent test results  History of Anesthesia Complications Negative for: history of anesthetic complications  Airway Mallampati: II  TM Distance: >3 FB Neck ROM: Full    Dental no notable dental hx.    Pulmonary neg pulmonary ROS,    Pulmonary exam normal        Cardiovascular hypertension, Pt. on medications Normal cardiovascular exam     Neuro/Psych negative neurological ROS  negative psych ROS   GI/Hepatic negative GI ROS, Neg liver ROS,   Endo/Other  Hypothyroidism   Renal/GU Renal InsufficiencyRenal disease  negative genitourinary   Musculoskeletal negative musculoskeletal ROS (+)   Abdominal   Peds  Hematology negative hematology ROS (+)   Anesthesia Other Findings Day of surgery medications reviewed with patient.  Reproductive/Obstetrics negative OB ROS                            Anesthesia Physical Anesthesia Plan  ASA: 2  Anesthesia Plan: Spinal   Post-op Pain Management: Tylenol PO (pre-op)*   Induction:   PONV Risk Score and Plan: 2 and Treatment may vary due to age or medical condition, Ondansetron and Dexamethasone  Airway Management Planned: Natural Airway and Simple Face Mask  Additional Equipment: None  Intra-op Plan:   Post-operative Plan:   Informed Consent: I have reviewed the patients History and Physical, chart, labs and discussed the procedure including the risks, benefits and alternatives for the proposed anesthesia with the patient or authorized representative who has indicated his/her understanding and acceptance.     Consent reviewed with POA  Plan Discussed with: CRNA  Anesthesia Plan Comments: (Consent reviewed with patient and son, Ortencia Kick, in preop room. Plan for spinal  anesthetic with GETA backup. DNR status discussed at length with Ortencia Kick and patient's other family members via telephone who are all in agreement that patient would like to remain DNR periprocedurally. They are understanding that ETT will be placed if GETA is required. All questions answered. Stephannie Peters, MD)       Anesthesia Quick Evaluation

## 2021-08-13 NOTE — Anesthesia Postprocedure Evaluation (Signed)
Anesthesia Post Note  Patient: Aundria Mems  Procedure(s) Performed: INTRAMEDULLARY (IM) NAIL INTERTROCHANTRIC (Left: Hip)     Patient location during evaluation: PACU Anesthesia Type: Spinal Level of consciousness: awake and alert Pain management: pain level controlled Vital Signs Assessment: post-procedure vital signs reviewed and stable Respiratory status: spontaneous breathing, nonlabored ventilation and respiratory function stable Cardiovascular status: blood pressure returned to baseline Postop Assessment: no apparent nausea or vomiting, spinal receding, no headache and no backache Anesthetic complications: no   No notable events documented.  Last Vitals:  Vitals:   08/13/21 1845 08/13/21 1900  BP: (!) 113/58 126/68  Pulse: 89 88  Resp: 20 (!) 21  Temp:    SpO2: 98% 97%    Last Pain:  Vitals:   08/13/21 1900  TempSrc:   PainSc: 0-No pain                 Shanda Howells

## 2021-08-13 NOTE — Anesthesia Procedure Notes (Signed)
Spinal  Patient location during procedure: OR Start time: 08/13/2021 4:42 PM End time: 08/13/2021 4:47 PM Reason for block: surgical anesthesia Staffing Performed: anesthesiologist  Anesthesiologist: Kaylyn Layer, MD Performed by: Kaylyn Layer, MD Authorized by: Kaylyn Layer, MD   Preanesthetic Checklist Completed: patient identified, IV checked, risks and benefits discussed, surgical consent, monitors and equipment checked, pre-op evaluation and timeout performed Spinal Block Patient position: right lateral decubitus Prep: DuraPrep and site prepped and draped Patient monitoring: continuous pulse ox, blood pressure and heart rate Approach: right paramedian Location: L3-4 Injection technique: single-shot Needle Needle type: Pencan  Needle gauge: 24 G Needle length: 9 cm Assessment Events: CSF return Additional Notes Risks, benefits, and alternative discussed. Patient gave consent to procedure. Prepped and draped in sitting position. Patient sedated but responsive to voice. Clear CSF obtained on second attempt (first midline at L3-4, second right paramedian L3-4). Positive terminal aspiration. No pain or paraesthesias with injection. Patient tolerated procedure well. Vital signs stable. Amalia Greenhouse, MD

## 2021-08-13 NOTE — Progress Notes (Signed)
Palliative Care consult received and chart reviewed in detail. High pre-admission cognitive and functional status. Goals of care are clear and are well established-full scope medical/surgical intervention with plan for complete recovery. Pain is being controlled via interventional modalities with anesthesia consultation. Our service is available to support if there are additional pain or symptom management needs or if there is a change in her status or anticipated trajectory, otherwise will sign off.  Anderson Malta, DO Palliative Medicine

## 2021-08-14 ENCOUNTER — Encounter (HOSPITAL_COMMUNITY): Payer: Self-pay | Admitting: Orthopedic Surgery

## 2021-08-14 DIAGNOSIS — H903 Sensorineural hearing loss, bilateral: Secondary | ICD-10-CM

## 2021-08-14 DIAGNOSIS — Z8739 Personal history of other diseases of the musculoskeletal system and connective tissue: Secondary | ICD-10-CM | POA: Diagnosis not present

## 2021-08-14 DIAGNOSIS — N179 Acute kidney failure, unspecified: Secondary | ICD-10-CM | POA: Diagnosis not present

## 2021-08-14 DIAGNOSIS — E559 Vitamin D deficiency, unspecified: Secondary | ICD-10-CM

## 2021-08-14 DIAGNOSIS — D62 Acute posthemorrhagic anemia: Secondary | ICD-10-CM

## 2021-08-14 DIAGNOSIS — R410 Disorientation, unspecified: Secondary | ICD-10-CM

## 2021-08-14 DIAGNOSIS — S72002A Fracture of unspecified part of neck of left femur, initial encounter for closed fracture: Secondary | ICD-10-CM | POA: Diagnosis not present

## 2021-08-14 LAB — CBC
HCT: 32.4 % — ABNORMAL LOW (ref 36.0–46.0)
Hemoglobin: 10.8 g/dL — ABNORMAL LOW (ref 12.0–15.0)
MCH: 30.6 pg (ref 26.0–34.0)
MCHC: 33.3 g/dL (ref 30.0–36.0)
MCV: 91.8 fL (ref 80.0–100.0)
Platelets: 206 10*3/uL (ref 150–400)
RBC: 3.53 MIL/uL — ABNORMAL LOW (ref 3.87–5.11)
RDW: 13.6 % (ref 11.5–15.5)
WBC: 13.7 10*3/uL — ABNORMAL HIGH (ref 4.0–10.5)
nRBC: 0 % (ref 0.0–0.2)

## 2021-08-14 LAB — RENAL FUNCTION PANEL
Albumin: 2.9 g/dL — ABNORMAL LOW (ref 3.5–5.0)
Anion gap: 7 (ref 5–15)
BUN: 28 mg/dL — ABNORMAL HIGH (ref 8–23)
CO2: 24 mmol/L (ref 22–32)
Calcium: 8.4 mg/dL — ABNORMAL LOW (ref 8.9–10.3)
Chloride: 108 mmol/L (ref 98–111)
Creatinine, Ser: 0.98 mg/dL (ref 0.44–1.00)
GFR, Estimated: 52 mL/min — ABNORMAL LOW (ref >60)
Glucose, Bld: 208 mg/dL — ABNORMAL HIGH (ref 70–99)
Phosphorus: 4.2 mg/dL (ref 2.5–4.6)
Potassium: 4.2 mmol/L (ref 3.5–5.1)
Sodium: 139 mmol/L (ref 135–145)

## 2021-08-14 LAB — TSH: TSH: 1.011 u[IU]/mL (ref 0.350–4.500)

## 2021-08-14 LAB — MAGNESIUM: Magnesium: 2.2 mg/dL (ref 1.7–2.4)

## 2021-08-14 LAB — CK: Total CK: 123 U/L (ref 38–234)

## 2021-08-14 MED ORDER — VITAMIN D (ERGOCALCIFEROL) 1.25 MG (50000 UNIT) PO CAPS
50000.0000 [IU] | ORAL_CAPSULE | ORAL | Status: DC
  Administered 2021-08-14: 50000 [IU] via ORAL
  Filled 2021-08-14: qty 1

## 2021-08-14 MED ORDER — ASPIRIN 81 MG PO CHEW: 81.0000 mg | CHEWABLE_TABLET | Freq: Two times a day (BID) | ORAL | 0 refills | Status: AC

## 2021-08-14 MED ORDER — HYDROCODONE-ACETAMINOPHEN 10-325 MG PO TABS: 0.5000 | ORAL_TABLET | ORAL | 0 refills | Status: AC | PRN

## 2021-08-14 NOTE — Evaluation (Signed)
Physical Therapy Evaluation Patient Details Name: Jennifer Gallegos MRN: [id number] DOB: 1919/09/02 Today's Date: 08/14/2021  History of Present Illness  Pt s/p fall with L hip fx and now s/p IM nailing.  Clinical Impression  Pt admitted as above and presenting with functional mobility limitations 2* decreased L LE strength/ROM, post op pain and balance deficits.  Pt would benefit from follow up rehab at SNF level to maximize IND and safety prior to return to IND living facility.     Recommendations for follow up therapy are one component of a multi-disciplinary discharge planning process, led by the attending physician.  Recommendations may be updated based on patient status, additional functional criteria and insurance authorization.  Follow Up Recommendations Skilled nursing-short term rehab (<3 hours/day) Can patient physically be transported by private vehicle: No    Assistance Recommended at Discharge Frequent or constant Supervision/Assistance  Patient can return home with the following  A lot of help with walking and/or transfers;A little help with bathing/dressing/bathroom;Assistance with cooking/housework;Assist for transportation;Help with stairs or ramp for entrance    Equipment Recommendations Rolling walker (2 wheels) (Youth level RW)  Recommendations for Other Services       Functional Status Assessment Patient has had a recent decline in their functional status and demonstrates the ability to make significant improvements in function in a reasonable and predictable amount of time.     Precautions / Restrictions Precautions Precautions: Fall Precaution Comments: very HOH, has hearing aids Restrictions Weight Bearing Restrictions: No LLE Weight Bearing: Weight bearing as tolerated      Mobility  Bed Mobility Overal bed mobility: Needs Assistance Bed Mobility: Supine to Sit     Supine to sit: Min assist, Mod assist     General bed mobility comments: Increased time  with cues for sequence and physical assist to manage L LE and to bring trunk to control trunk    Transfers Overall transfer level: Needs assistance Equipment used: Rolling walker (2 wheels) Transfers: Sit to/from Stand Sit to Stand: Min assist, Mod assist, +2 safety/equipment, From elevated surface           General transfer comment: cues for LE management and use of UEs to self assist    Ambulation/Gait Ambulation/Gait assistance: Min assist, Mod assist Gait Distance (Feet): 34 Feet Assistive device: Rolling walker (2 wheels) Gait Pattern/deviations: Step-to pattern, Step-through pattern, Decreased step length - right, Decreased step length - left, Shuffle, Trunk flexed, Antalgic, Decreased stance time - left Gait velocity: decr     General Gait Details: cues for sequence, posture and position from [employer]            Wheelchair Mobility    Modified Rankin (Stroke Patients Only)       Balance Overall balance assessment: Needs assistance Sitting-balance support: No upper extremity supported, Feet supported Sitting balance-Leahy Scale: Fair     Standing balance support: Bilateral upper extremity supported Standing balance-Leahy Scale: Poor                               Pertinent Vitals/Pain Pain Assessment Pain Assessment: Faces Faces Pain Scale: Hurts little more Pain Location: L hip with WB Pain Descriptors / Indicators: Aching, Sore Pain Intervention(s): Limited activity within patient's tolerance, Monitored during session, Premedicated before session, Ice applied    Home Living Family/patient expects to be discharged to:: Skilled nursing facility  Additional Comments: Patient from Independent Living at [facility].    Prior Function Prior Level of Function : Independent/Modified Independent             Mobility Comments: uses a cane ADLs Comments: has assist for bathing and medications only      Hand Dominance        Extremity/Trunk Assessment   Upper Extremity Assessment Upper Extremity Assessment: Overall WFL for tasks assessed    Lower Extremity Assessment Lower Extremity Assessment: Defer to PT evaluation    Cervical / Trunk Assessment Cervical / Trunk Assessment: Normal  Communication   Communication: HOH  Cognition Arousal/Alertness: Awake/alert Behavior During Therapy: WFL for tasks assessed/performed Overall Cognitive Status: Within Functional Limits for tasks assessed                                          General Comments      Exercises     Assessment/Plan    PT Assessment Patient needs continued PT services  PT Problem List Decreased strength;Decreased range of motion;Decreased activity tolerance;Decreased balance;Decreased mobility;Decreased knowledge of use of DME;Pain       PT Treatment Interventions DME instruction;Gait training;Functional mobility training;Therapeutic activities;Therapeutic exercise;Patient/family education    PT Goals (Current goals can be found in the Care Plan section)  Acute Rehab PT Goals Patient Stated Goal: Regain IND PT Goal Formulation: With patient Time For Goal Achievement: 08/21/21 Potential to Achieve Goals: Good    Frequency Min 4X/week     Co-evaluation PT/OT/SLP Co-Evaluation/Treatment: Yes Reason for Co-Treatment: For patient/therapist safety;To address functional/ADL transfers PT goals addressed during session: Mobility/safety with mobility OT goals addressed during session: ADL's and self-care       AM-PAC PT "6 Clicks" Mobility  Outcome Measure Help needed turning from your back to your side while in a flat bed without using bedrails?: A Lot Help needed moving from lying on your back to sitting on the side of a flat bed without using bedrails?: A Lot Help needed moving to and from a bed to a chair (including a wheelchair)?: A Lot Help needed standing up from a chair  using your arms (e.g., wheelchair or bedside chair)?: A Lot Help needed to walk in hospital room?: A Little Help needed climbing 3-5 steps with a railing? : A Lot 6 Click Score: 13    End of Session Equipment Utilized During Treatment: Gait belt Activity Tolerance: Patient tolerated treatment well Patient left: in chair;with call bell/phone within reach;with chair alarm set;with family/visitor present Nurse Communication: Mobility status PT Visit Diagnosis: Unsteadiness on feet (R26.81);Difficulty in walking, not elsewhere classified (R26.2);Pain Pain - Right/Left: Left Pain - part of body: Hip    Time: 1012-1035 PT Time Calculation (min) (ACUTE ONLY): 23 min   Charges:   PT Evaluation $PT Eval Low Complexity: 1 Low          Mauro Kaufmann PT Acute Rehabilitation Services Pager [phone] Office [phone]   Izyk Marty 08/14/2021, 12:34 PM

## 2021-08-14 NOTE — NC FL2 (Signed)
[place] MEDICAID FL2 LEVEL OF CARE SCREENING TOOL     IDENTIFICATION  Patient Name: Jennifer Gallegos Birthdate: 31-Oct-1919 Sex: female Admission Date (Current Location): 08/11/2021  [facility] and [place] Number:      Facility and Address:  [facility],  [address], [place] [address]      Provider Number: [id number]  Attending Physician Name and Address:  Almon Hercules, MD  Relative Name and Phone Number:  son, Sulay Brymer @ [phone]    Current Level of Care: Hospital Recommended Level of Care: Skilled Nursing Facility Prior Approval Number:    Date Approved/Denied:   PASRR Number: [id number]  Discharge Plan: SNF    Current Diagnoses: Patient Active Problem List   Diagnosis Date Noted   Acute blood loss anemia 08/14/2021   Cognitive impairment 08/13/2021   History of osteoporosis 08/13/2021   Vitamin D deficiency 08/13/2021   Femoral neck fracture (HCC) 08/11/2021   Primary hypertension 08/11/2021   Hypothyroidism 08/11/2021   AKI (acute kidney injury) (HCC) 08/11/2021   Leukocytosis 08/11/2021   Hyperglycemia 08/11/2021   Upper abdominal pain 02/20/2019   Abnormal findings on diagnostic imaging of other abdominal regions, including retroperitoneum 02/20/2019   Gait abnormality 10/27/2017   Osteoporosis 10/27/2017    Orientation RESPIRATION BLADDER Height & Weight     Self, Place, Situation  Normal Incontinent, External catheter (currently with purewick) Weight: 103 lb 6.4 oz (46.9 kg) Height:  5' (152.4 cm)  BEHAVIORAL SYMPTOMS/MOOD NEUROLOGICAL BOWEL NUTRITION STATUS      Continent    AMBULATORY STATUS COMMUNICATION OF NEEDS Skin   Limited Assist Verbally Other (Comment) (surgical incision only)                       Personal Care Assistance Level of Assistance  Bathing, Dressing Bathing Assistance: Limited assistance   Dressing Assistance: Limited assistance     Functional Limitations Info  Hearing   Hearing Info:  Impaired      SPECIAL CARE FACTORS FREQUENCY  PT (By licensed PT), OT (By licensed OT)     PT Frequency: 5x/wk OT Frequency: 5x/wk            Contractures Contractures Info: Not present    Additional Factors Info  Code Status, Allergies Code Status Info: DNR Allergies Info: Penicillins, Macrobid (Nitrofurantoin), Sulfa Antibiotics           Current Medications (08/14/2021):  This is the current hospital active medication list Current Facility-Administered Medications  Medication Dose Route Frequency Provider Last Rate Last Admin   bisacodyl (DULCOLAX) EC tablet 5 mg  5 mg Oral Daily PRN Swinteck, Arlys John, MD       docusate sodium (COLACE) capsule 100 mg  100 mg Oral BID Samson Frederic, MD   100 mg at 08/14/21 1137   enoxaparin (LOVENOX) injection 30 mg  30 mg Subcutaneous Q24H Swinteck, Arlys John, MD   30 mg at 08/14/21 0745   feeding supplement (ENSURE ENLIVE / ENSURE PLUS) liquid 237 mL  237 mL Oral BID BM Swinteck, Arlys John, MD   237 mL at 08/14/21 1327   ferrous sulfate tablet 325 mg  325 mg Oral TID PC Swinteck, Arlys John, MD   325 mg at 08/14/21 1327   HYDROcodone-acetaminophen (NORCO/VICODIN) 5-325 MG per tablet 1 tablet  1 tablet Oral Q6H PRN Samson Frederic, MD   1 tablet at 08/14/21 1327   labetalol (NORMODYNE) injection 10 mg  10 mg Intravenous Q2H PRN Samson Frederic, MD  levothyroxine (SYNTHROID) tablet 88 mcg  88 mcg Oral QAC breakfast Samson Frederic, MD   88 mcg at 08/14/21 0557   menthol-cetylpyridinium (CEPACOL) lozenge 3 mg  1 lozenge Oral PRN Swinteck, Arlys John, MD       Or   phenol (CHLORASEPTIC) mouth spray 1 spray  1 spray Mouth/Throat PRN Swinteck, Arlys John, MD       methocarbamol (ROBAXIN) tablet 500 mg  500 mg Oral Q6H PRN Swinteck, Arlys John, MD       Or   methocarbamol (ROBAXIN) 500 mg in dextrose 5 % 50 mL IVPB  500 mg Intravenous Q6H PRN Swinteck, Arlys John, MD       metoCLOPramide (REGLAN) tablet 5-10 mg  5-10 mg Oral Q8H PRN Swinteck, Arlys John, MD       Or    metoCLOPramide (REGLAN) injection 5-10 mg  5-10 mg Intravenous Q8H PRN Swinteck, Arlys John, MD       morphine (PF) 2 MG/ML injection 0.5 mg  0.5 mg Intravenous Q2H PRN Swinteck, Arlys John, MD       multivitamin with minerals tablet 1 tablet  1 tablet Oral Daily Swinteck, Arlys John, MD   1 tablet at 08/14/21 1138   mupirocin ointment (BACTROBAN) 2 % 1 Application  1 Application Nasal BID Samson Frederic, MD   1 Application at 08/14/21 1139   ondansetron (ZOFRAN) tablet 4 mg  4 mg Oral Q6H PRN Swinteck, Arlys John, MD       Or   ondansetron (ZOFRAN) injection 4 mg  4 mg Intravenous Q6H PRN Swinteck, Arlys John, MD       polyethylene glycol (MIRALAX / GLYCOLAX) packet 17 g  17 g Oral Daily PRN Swinteck, Arlys John, MD       senna (SENOKOT) tablet 8.6 mg  1 tablet Oral BID Samson Frederic, MD   8.6 mg at 08/14/21 1137   sodium phosphate (FLEET) 7-19 GM/118ML enema 1 enema  1 enema Rectal Once PRN Samson Frederic, MD       Vitamin D (Ergocalciferol) (DRISDOL) capsule 50,000 Units  50,000 Units Oral Q7 days Almon Hercules, MD   50,000 Units at 08/14/21 1327     Discharge Medications: Please see discharge summary for a list of discharge medications.  Relevant Imaging Results:  Relevant Lab Results:   Additional Information SS# [id number]  Amada Jupiter, LCSW

## 2021-08-14 NOTE — Evaluation (Signed)
Occupational Therapy Evaluation Patient Details Name: Jennifer Gallegos MRN: [id number] DOB: [birth year] Today's Date: 08/14/2021   History of Present Illness Pt s/p fall with L hip fx and now s/p IM nailing.   Clinical Impression   Mrs. Jennifer Gallegos is a 88 year old woman s/p hip fracture repair with decreased ROM and strength of LLE, impaired balance, decreased activity tolerance and reports of pain resulting in a sudden decline in functional mobility and ability to perform independent ADLs. Patient needing min assist for ambulation and instruction on use of walker and mod-max assist for LB ADLs. Patient will benefit from skilled OT services while in hospital to improve deficits and learn compensatory strategies as needed in order to return to PLOF.         Recommendations for follow up therapy are one component of a multi-disciplinary discharge planning process, led by the attending physician.  Recommendations may be updated based on patient status, additional functional criteria and insurance authorization.   Follow Up Recommendations  Skilled nursing-short term rehab (<3 hours/day)    Assistance Recommended at Discharge Frequent or constant Supervision/Assistance  Patient can return home with the following A little help with walking and/or transfers;A lot of help with bathing/dressing/bathroom    Functional Status Assessment  Patient has had a recent decline in their functional status and demonstrates the ability to make significant improvements in function in a reasonable and predictable amount of time.  Equipment Recommendations  None recommended by OT    Recommendations for Other Services       Precautions / Restrictions Precautions Precautions: Fall Precaution Comments: very HOH, has hearing aids Restrictions Weight Bearing Restrictions: No LLE Weight Bearing: Weight bearing as tolerated      Mobility Bed Mobility Overal bed mobility: Needs Assistance Bed Mobility: Supine to  Sit     Supine to sit: Min assist, Mod assist     General bed mobility comments: Increased time with cues for sequence and physical assist to manage L LE and to bring trunk to control trunk    Transfers Overall transfer level: Needs assistance Equipment used: Rolling walker (2 wheels) Transfers: Sit to/from Stand Sit to Stand: Min assist, Mod assist, +2 safety/equipment, From elevated surface           General transfer comment: cues for LE management and use of UEs to self assist      Balance Overall balance assessment: Needs assistance Sitting-balance support: No upper extremity supported, Feet supported Sitting balance-Leahy Scale: Fair     Standing balance support: Bilateral upper extremity supported Standing balance-Leahy Scale: Poor                             ADL either performed or assessed with clinical judgement   ADL Overall ADL's : Needs assistance/impaired Eating/Feeding: Set up;Sitting   Grooming: Set up;Sitting   Upper Body Bathing: Set up;Sitting   Lower Body Bathing: Maximal assistance;Sit to/from stand   Upper Body Dressing : Set up;Sitting   Lower Body Dressing: Maximal assistance;Sit to/from stand   Toilet Transfer: Minimal assistance;Ambulation;Regular Toilet;Rolling walker (2 wheels);Grab bars   Toileting- Clothing Manipulation and Hygiene: Sit to/from stand       Functional mobility during ADLs: Minimal assistance;Rolling walker (2 wheels)       Vision Patient Visual Report: No change from baseline       Perception     Praxis      Pertinent Vitals/Pain Pain Assessment Pain Assessment:  Faces Pain Location: Hip Pain Descriptors / Indicators: Grimacing Pain Intervention(s): Monitored during session     Hand Dominance     Extremity/Trunk Assessment Upper Extremity Assessment Upper Extremity Assessment: Overall WFL for tasks assessed   Lower Extremity Assessment Lower Extremity Assessment: Defer to PT  evaluation   Cervical / Trunk Assessment Cervical / Trunk Assessment: Normal   Communication Communication Communication: HOH   Cognition Arousal/Alertness: Awake/alert Behavior During Therapy: WFL for tasks assessed/performed Overall Cognitive Status: Within Functional Limits for tasks assessed                                       General Comments       Exercises     Shoulder Instructions      Home Living Family/patient expects to be discharged to:: Skilled nursing facility                                 Additional Comments: Patient from Independent Living at [facility].      Prior Functioning/Environment Prior Level of Function : Independent/Modified Independent             Mobility Comments: uses a cane ADLs Comments: has assist for bathing and medications only        OT Problem List: Decreased strength;Decreased range of motion;Decreased activity tolerance;Impaired balance (sitting and/or standing);Decreased knowledge of use of DME or AE;Decreased knowledge of precautions;Pain      OT Treatment/Interventions: Self-care/ADL training;Therapeutic exercise;DME and/or AE instruction;Therapeutic activities;Balance training;Patient/family education    OT Goals(Current goals can be found in the care plan section) Acute Rehab OT Goals Patient Stated Goal: to walk further OT Goal Formulation: With patient Time For Goal Achievement: 08/28/21 Potential to Achieve Goals: Good  OT Frequency: Min 2X/week    Co-evaluation PT/OT/SLP Co-Evaluation/Treatment: Yes (co-eval) Reason for Co-Treatment: For patient/therapist safety;To address functional/ADL transfers PT goals addressed during session: Mobility/safety with mobility OT goals addressed during session: ADL's and self-care      AM-PAC OT "6 Clicks" Daily Activity     Outcome Measure Help from another person eating meals?: None Help from another person taking care of personal  grooming?: A Little Help from another person toileting, which includes using toliet, bedpan, or urinal?: A Lot Help from another person bathing (including washing, rinsing, drying)?: A Little Help from another person to put on and taking off regular upper body clothing?: A Little Help from another person to put on and taking off regular lower body clothing?: A Lot 6 Click Score: 17   End of Session Equipment Utilized During Treatment: Gait belt;Rolling walker (2 wheels) Nurse Communication: Mobility status  Activity Tolerance: Patient tolerated treatment well Patient left: in chair;with call bell/phone within reach;with chair alarm set  OT Visit Diagnosis: Other abnormalities of gait and mobility (R26.89);Pain                Time: [id number] OT Time Calculation (min): 29 min Charges:  OT General Charges $OT Visit: 1 Visit OT Evaluation $OT Eval Low Complexity: 1 Low  Shateka Petrea, OTR/L Acute Care Rehab Services  Office [phone] Pager: [phone]   Kelli Churn 08/14/2021, 12:37 PM

## 2021-08-14 NOTE — Progress Notes (Signed)
Physical Therapy Treatment Patient Details Name: Jennifer Gallegos MRN: [id number] DOB: 12/08/1919 Today's Date: 08/14/2021   History of Present Illness Pt s/p fall with L hip fx and now s/p IM nailing.    PT Comments    Pt continues very cooperative and up to ambulate limited distance in hall before being assisted to bed.   Recommendations for follow up therapy are one component of a multi-disciplinary discharge planning process, led by the attending physician.  Recommendations may be updated based on patient status, additional functional criteria and insurance authorization.  Follow Up Recommendations  Skilled nursing-short term rehab (<3 hours/day) Can patient physically be transported by private vehicle: No   Assistance Recommended at Discharge Frequent or constant Supervision/Assistance  Patient can return home with the following A lot of help with walking and/or transfers;A little help with bathing/dressing/bathroom;Assistance with cooking/housework;Assist for transportation;Help with stairs or ramp for entrance   Equipment Recommendations  Rolling walker (2 wheels)    Recommendations for Other Services       Precautions / Restrictions Precautions Precautions: Fall Precaution Comments: very HOH, has hearing aids Restrictions Weight Bearing Restrictions: No LLE Weight Bearing: Weight bearing as tolerated     Mobility  Bed Mobility Overal bed mobility: Needs Assistance Bed Mobility: Sit to Supine     Supine to sit: Min assist, Mod assist Sit to supine: Mod assist   General bed mobility comments: Increased time with cues for sequence and physical assist to manage L LE and to control trunk    Transfers Overall transfer level: Needs assistance Equipment used: Rolling walker (2 wheels) Transfers: Sit to/from Stand Sit to Stand: Min assist, Mod assist           General transfer comment: cues for LE management and use of UEs to self assist     Ambulation/Gait Ambulation/Gait assistance: Min assist Gait Distance (Feet): 28 Feet Assistive device: Rolling walker (2 wheels) Gait Pattern/deviations: Step-to pattern, Step-through pattern, Decreased step length - right, Decreased step length - left, Shuffle, Trunk flexed, Antalgic, Decreased stance time - left Gait velocity: decr     General Gait Details: cues for sequence, posture and position from [employer]             Wheelchair Mobility    Modified Rankin (Stroke Patients Only)       Balance Overall balance assessment: Needs assistance Sitting-balance support: No upper extremity supported, Feet supported Sitting balance-Leahy Scale: Fair     Standing balance support: Bilateral upper extremity supported Standing balance-Leahy Scale: Poor                              Cognition Arousal/Alertness: Awake/alert Behavior During Therapy: WFL for tasks assessed/performed Overall Cognitive Status: Within Functional Limits for tasks assessed                                          Exercises      General Comments        Pertinent Vitals/Pain Pain Assessment Pain Assessment: Faces Faces Pain Scale: Hurts little more Pain Location: Hip Pain Descriptors / Indicators: Grimacing Pain Intervention(s): Limited activity within patient's tolerance, Monitored during session, Patient requesting pain meds-RN notified, RN gave pain meds during session    Home Living Family/patient expects to be discharged to:: Skilled nursing facility  Additional Comments: Patient from Independent Living at [facility].    Prior Function            PT Goals (current goals can now be found in the care plan section) Acute Rehab PT Goals Patient Stated Goal: Regain IND PT Goal Formulation: With patient Time For Goal Achievement: 08/21/21 Potential to Achieve Goals: Good Progress towards PT goals: Progressing toward  goals    Frequency    Min 4X/week      PT Plan Current plan remains appropriate    Co-evaluation PT/OT/SLP Co-Evaluation/Treatment: Yes Reason for Co-Treatment: For patient/therapist safety;To address functional/ADL transfers PT goals addressed during session: Mobility/safety with mobility OT goals addressed during session: ADL's and self-care      AM-PAC PT "6 Clicks" Mobility   Outcome Measure  Help needed turning from your back to your side while in a flat bed without using bedrails?: A Lot Help needed moving from lying on your back to sitting on the side of a flat bed without using bedrails?: A Lot Help needed moving to and from a bed to a chair (including a wheelchair)?: A Lot Help needed standing up from a chair using your arms (e.g., wheelchair or bedside chair)?: A Lot Help needed to walk in hospital room?: A Little Help needed climbing 3-5 steps with a railing? : A Lot 6 Click Score: 13    End of Session Equipment Utilized During Treatment: Gait belt Activity Tolerance: Patient tolerated treatment well Patient left: in bed;with call bell/phone within reach;with family/visitor present Nurse Communication: Mobility status PT Visit Diagnosis: Unsteadiness on feet (R26.81);Difficulty in walking, not elsewhere classified (R26.2);Pain Pain - Right/Left: Left Pain - part of body: Hip     Time: 3094-0768 PT Time Calculation (min) (ACUTE ONLY): 22 min  Charges:  $Gait Training: 8-22 mins                     Mauro Kaufmann PT Acute Rehabilitation Services Pager [phone] Office [phone]    Jennifer Gallegos 08/14/2021, 3:47 PM

## 2021-08-14 NOTE — TOC Progression Note (Signed)
Transition of Care [facility]) - Progression Note    Patient Details  Name: Jennifer Gallegos MRN: [id number] Date of Birth: 04-15-1919  [facility] CM/SW Contact  Lennart Pall, LCSW Phone Number: 08/14/2021, 2:16 PM  Clinical Narrative:    Met with pt/ son and all agreed for plan for SNF rehab - bed search initiated.   Expected Discharge Plan: [place] Barriers to Discharge: Continued Medical Work up  Expected Discharge Plan and Services Expected Discharge Plan: [place] In-house Referral: Clinical Social Work   Post Acute Care Choice: [place] Living arrangements for the past 2 months: [place]                 DME Arranged: N/A DME Agency: NA                   Social Determinants of Health (SDOH) Interventions    Readmission Risk Interventions    08/12/2021    3:14 PM  Readmission Risk Prevention Plan  Post Dischage Appt Complete  Medication Screening Complete  Transportation Screening Complete

## 2021-08-14 NOTE — Plan of Care (Signed)
  Problem: Clinical Measurements: Goal: Respiratory complications will improve Outcome: Not Applicable Goal: Cardiovascular complication will be avoided Outcome: Progressing   Problem: Coping: Goal: Level of anxiety will decrease Outcome: Progressing

## 2021-08-14 NOTE — Progress Notes (Signed)
PROGRESS NOTE  Jennifer Gallegos TOI:[id number] DOB: [birth year]   PCP: Andreas Blower., MD  Patient is from: ALF/ALF  DOA: 08/11/2021 LOS: 3  Chief complaints Chief Complaint  Patient presents with   Fall     Brief Narrative / Interim history: 88 year old F with PMH of HTN, hypothyroidism and bladder pain presenting with left hip pain after she stepped off the curb  and fell, and admitted for left femoral neck fracture.  Orthopedic surgery consulted.  Patient underwent intramedullary fixation of left femoral fracture by Dr. Linna Caprice on 08/13/2021.  Therapy recommended SNF.  Subjective: Seen and examined earlier this morning.  No major events overnight of this morning.  Patient's son and daughter-in-law at bedside.  They are concerned about missing one of her hearing aid.  She has difficulty hearing without it.  Does not seem to be in pain.   Objective: Vitals:   08/13/21 2230 08/14/21 0209 08/14/21 0455 08/14/21 0934  BP: 129/86 121/62 (!) 110/50 118/60  Pulse: 93 88 93 99  Resp: 17 17 17    Temp: 98 F (36.7 C) 98.1 F (36.7 C) 97.6 F (36.4 C) 98.1 F (36.7 C)  TempSrc: Oral Oral Oral Oral  SpO2: 100% 96% 95%   Weight:      Height:        Examination:  GENERAL: No apparent distress.  Nontoxic. HEENT: MMM.  Vision grossly intact.  Hard of hearing. NECK: Supple.  No apparent JVD.  RESP:  No IWOB.  Fair aeration bilaterally. CVS:  RRR. Heart sounds normal.  ABD/GI/GU: BS+. Abd soft, NTND.  MSK/EXT:  Moves extremities. No apparent deformity. No edema.  SKIN: Dressing over surgical site DCI. NEURO: Awake and alert. Oriented appropriately.  No apparent focal neuro deficit. PSYCH: Calm. Normal affect.   Procedures:  7/5-intramedullary fixation of left femoral fracture  Microbiology summarized: COVID-19 and influenza PCR nonreactive.  Assessment and plan: Principal Problem:   Femoral neck fracture (HCC) Active Problems:   Primary hypertension   Hypothyroidism   AKI  (acute kidney injury) (HCC)   Leukocytosis   Hyperglycemia   Cognitive impairment   History of osteoporosis   Vitamin D deficiency   Acute blood loss anemia   Accidental fall-history of gait abnormality Left femoral neck fracture due to accidental fall and osteoporosis Vitamin D deficiency History of osteoporosis -CT head/cervical spine without acute finding. -S/p intramedullary fixation of left femoral fracture by Dr. on 7/5 -Pain control and VTE prophylaxis per surgery -Start p.o. vitamin D.  Delirium: Seems to have resolved.  She has hard of hearing and missing one of the hearing aid this morning. -Reorientation and delirium precautions.  Acute blood loss anemia?  Surgical EBL reported to be 100 cc.  Could be hemodilution from IV fluid as well Recent Labs    08/11/21 1608 08/12/21 0324 08/13/21 0347 08/14/21 0353  HGB 14.2 13.2 12.9 10.8*  -Monitor H&H -Check anemia panel -Discontinue IV fluid  AKI: Resolved.  CKD ruled out. Recent Labs    08/11/21 1608 08/12/21 0324 08/13/21 0347 08/14/21 0353  BUN 32* 28* 23 28*  CREATININE 1.11* 0.98 0.84 0.98  -Monitor  Essential hypertension: Normotensive. -Continue holding home Lotrel -Labetalol as needed  Hypothyroidism -Continue home Synthroid  History of bladder pain?  Does not seem to be on medication. Leukocytosis/bandemia: Likely demargination.  Improved.  Goal of care counseling: DNR/DNI appropriate. -Palliative medicine on board  Increased nutrient needs Body mass index is 20.19 kg/m. Nutrition Problem: Increased nutrient needs Etiology: acute  illness Signs/Symptoms: estimated needs Interventions: Ensure Enlive (each supplement provides 350kcal and 20 grams of protein), MVI, Liberalize Diet   DVT prophylaxis:  enoxaparin (LOVENOX) injection 30 mg Start: 08/14/21 0800 SCDs Start: 08/13/21 2030  Code Status: DNR/DNI Family Communication: Updated patient's son and daughter-in-law at  bedside. Level of care: Telemetry Status is: Inpatient Remains inpatient appropriate because: Left femoral neck fracture   Final disposition: SNF Consultants:  Orthopedic surgery Palliative medicine  Sch Meds:  Scheduled Meds:  docusate sodium  100 mg Oral BID   enoxaparin (LOVENOX) injection  30 mg Subcutaneous Q24H   feeding supplement  237 mL Oral BID BM   ferrous sulfate  325 mg Oral TID PC   levothyroxine  88 mcg Oral QAC breakfast   multivitamin with minerals  1 tablet Oral Daily   mupirocin ointment  1 Application Nasal BID   senna  1 tablet Oral BID   Vitamin D (Ergocalciferol)  50,000 Units Oral Q7 days   Continuous Infusions:  methocarbamol (ROBAXIN) IV     PRN Meds:.bisacodyl, HYDROcodone-acetaminophen, labetalol, menthol-cetylpyridinium **OR** phenol, methocarbamol **OR** methocarbamol (ROBAXIN) IV, metoCLOPramide **OR** metoCLOPramide (REGLAN) injection, morphine injection, ondansetron **OR** ondansetron (ZOFRAN) IV, polyethylene glycol, sodium phosphate  Antimicrobials: Anti-infectives (From admission, onward)    Start     Dose/Rate Route Frequency Ordered Stop   08/14/21 0600  ceFAZolin (ANCEF) IVPB 2g/100 mL premix  Status:  Discontinued        2 g 200 mL/hr over 30 Minutes Intravenous On call to O.R. 08/13/21 2021 08/13/21 2022   08/13/21 2300  ceFAZolin (ANCEF) IVPB 2g/100 mL premix        2 g 200 mL/hr over 30 Minutes Intravenous Every 6 hours 08/13/21 1946 08/14/21 0627   08/13/21 1530  ceFAZolin (ANCEF) IVPB 2g/100 mL premix        2 g 200 mL/hr over 30 Minutes Intravenous On call to O.R. 08/13/21 1518 08/13/21 1648        I have personally reviewed the following labs and images: CBC: Recent Labs  Lab 08/11/21 1608 08/12/21 0324 08/13/21 0347 08/14/21 0353  WBC 15.6* 17.6* 21.6* 13.7*  NEUTROABS 13.0*  --  17.3*  --   HGB 14.2 13.2 12.9 10.8*  HCT 42.8 40.0 39.1 32.4*  MCV 92.4 92.0 92.2 91.8  PLT 275 242 250 206   BMP &GFR Recent  Labs  Lab 08/11/21 1608 08/12/21 0324 08/13/21 0347 08/14/21 0353  NA 141 140 138 139  K 3.7 4.1 3.5 4.2  CL 108 111 106 108  CO2 23 22 23 24   GLUCOSE 268* 210* 144* 208*  BUN 32* 28* 23 28*  CREATININE 1.11* 0.98 0.84 0.98  CALCIUM 8.9 8.7* 9.0 8.4*  MG  --   --  2.1 2.2  PHOS  --   --  2.4* 4.2   Estimated Creatinine Clearance: 22.5 mL/min (by C-G formula based on SCr of 0.98 mg/dL). Liver & Pancreas: Recent Labs  Lab 08/13/21 0347 08/14/21 0353  AST 21  --   ALT 15  --   ALKPHOS 78  --   BILITOT 1.1  --   PROT 6.5  --   ALBUMIN 3.7 2.9*   No results for input(s): "LIPASE", "AMYLASE" in the last 168 hours. No results for input(s): "AMMONIA" in the last 168 hours. Diabetic: No results for input(s): "HGBA1C" in the last 72 hours. No results for input(s): "GLUCAP" in the last 168 hours. Cardiac Enzymes: Recent Labs  Lab 08/14/21 [place]  123   No results for input(s): "PROBNP" in the last 8760 hours. Coagulation Profile: Recent Labs  Lab 08/11/21 1608  INR 1.0   Thyroid Function Tests: Recent Labs    08/14/21 0353  TSH 1.011   Lipid Profile: No results for input(s): "CHOL", "HDL", "LDLCALC", "TRIG", "CHOLHDL", "LDLDIRECT" in the last 72 hours. Anemia Panel: No results for input(s): "VITAMINB12", "FOLATE", "FERRITIN", "TIBC", "IRON", "RETICCTPCT" in the last 72 hours. Urine analysis:    Component Value Date/Time   COLORURINE YELLOW (A) 08/11/2021 2209   APPEARANCEUR CLOUDY (A) 08/11/2021 2209   LABSPEC 1.014 08/11/2021 2209   PHURINE 5.0 08/11/2021 2209   GLUCOSEU >=500 (A) 08/11/2021 2209   HGBUR SMALL (A) 08/11/2021 2209   BILIRUBINUR NEGATIVE 08/11/2021 2209   KETONESUR NEGATIVE 08/11/2021 2209   PROTEINUR 30 (A) 08/11/2021 2209   NITRITE NEGATIVE 08/11/2021 2209   LEUKOCYTESUR SMALL (A) 08/11/2021 2209   Sepsis Labs: Invalid input(s): "PROCALCITONIN", "LACTICIDVEN"  Microbiology: Recent Results (from the past 240 hour(s))  Resp  Panel by RT-PCR (Flu A&B, Covid) Anterior Nasal Swab     Status: None   Collection Time: 08/11/21  4:08 PM   Specimen: Anterior Nasal Swab  Result Value Ref Range Status   SARS Coronavirus 2 by RT PCR NEGATIVE NEGATIVE Final    Comment: (NOTE) SARS-CoV-2 target nucleic acids are NOT DETECTED.  The SARS-CoV-2 RNA is generally detectable in upper respiratory specimens during the acute phase of infection. The lowest concentration of SARS-CoV-2 viral copies this assay can detect is 138 copies/mL. A negative result does not preclude SARS-Cov-2 infection and should not be used as the sole basis for treatment or other patient management decisions. A negative result may occur with  improper specimen collection/handling, submission of specimen other than nasopharyngeal swab, presence of viral mutation(s) within the areas targeted by this assay, and inadequate number of viral copies(<138 copies/mL). A negative result must be combined with clinical observations, patient history, and epidemiological information. The expected result is Negative.  Fact Sheet for Patients:  EntrepreneurPulse.com.au  Fact Sheet for Healthcare Providers:  IncredibleEmployment.be  This test is no t yet approved or cleared by the [place] FDA and  has been authorized for detection and/or diagnosis of SARS-CoV-2 by FDA under an Emergency Use Authorization (EUA). This EUA will remain  in effect (meaning this test can be used) for the duration of the COVID-19 declaration under Section 564(b)(1) of the Act, 21 U.S.C.section 360bbb-3(b)(1), unless the authorization is terminated  or revoked sooner.       Influenza A by PCR NEGATIVE NEGATIVE Final   Influenza B by PCR NEGATIVE NEGATIVE Final    Comment: (NOTE) The Xpert Xpress SARS-CoV-2/FLU/RSV plus assay is intended as an aid in the diagnosis of influenza from Nasopharyngeal swab specimens and should not be used as a sole  basis for treatment. Nasal washings and aspirates are unacceptable for Xpert Xpress SARS-CoV-2/FLU/RSV testing.  Fact Sheet for Patients: EntrepreneurPulse.com.au  Fact Sheet for Healthcare Providers: IncredibleEmployment.be  This test is not yet approved or cleared by the [place] FDA and has been authorized for detection and/or diagnosis of SARS-CoV-2 by FDA under an Emergency Use Authorization (EUA). This EUA will remain in effect (meaning this test can be used) for the duration of the COVID-19 declaration under Section 564(b)(1) of the Act, 21 U.S.C. section 360bbb-3(b)(1), unless the authorization is terminated or revoked.  Performed at [facility], [place] [address], [place], [place] [address]   Surgical PCR screen  Status: Abnormal   Collection Time: 08/11/21 10:09 PM   Specimen: Nasal Mucosa; Nasal Swab  Result Value Ref Range Status   MRSA, PCR NEGATIVE NEGATIVE Final   Staphylococcus aureus POSITIVE (A) NEGATIVE Final    Comment: (NOTE) The Xpert SA Assay (FDA approved for NASAL specimens in patients 45 years of age and older), is one component of a comprehensive surveillance program. It is not intended to diagnose infection nor to guide or monitor treatment. Performed at [facility], [address], [place], [place] [address]     Radiology Studies: Pelvis Portable  Result Date: 08/13/2021 CLINICAL DATA:  Postop. EXAM: PORTABLE PELVIS 1-2 VIEWS COMPARISON:  Preoperative imaging. FINDINGS: Short intramedullary nail with trans trochanteric and distal locking screw fixation of intertrochanteric femur fracture. The fracture is in improved alignment from preoperative imaging. Recent postsurgical change includes air and edema in the soft tissues. Lateral skin staples in place. IMPRESSION: ORIF of intertrochanteric femur fracture, no immediate postoperative complication. Electronically Signed    By: Narda Rutherford M.D.   On: 08/13/2021 18:49   DG HIP UNILAT WITH PELVIS 2-3 VIEWS LEFT  Result Date: 08/13/2021 CLINICAL DATA:  Left femur IM nail. EXAM: DG HIP (WITH OR WITHOUT PELVIS) 2-3V LEFT COMPARISON:  Preoperative imaging. FINDINGS: Five fluoroscopic spot views of the left hip obtained in the operating room. Short intramedullary rod with trans trochanteric and distal locking screw fixation traverse intertrochanteric femur fracture. Fluoroscopy time 38 seconds. Dose 3.8291 mGy. IMPRESSION: Fluoroscopic spot views during left proximal femur fracture ORIF. Electronically Signed   By: Narda Rutherford M.D.   On: 08/13/2021 18:48   DG C-Arm 1-60 Min-No Report  Result Date: 08/13/2021 Fluoroscopy was utilized by the requesting physician.  No radiographic interpretation.   DG C-Arm 1-60 Min-No Report  Result Date: 08/13/2021 Fluoroscopy was utilized by the requesting physician.  No radiographic interpretation.      Savon Bordonaro T. Sophea Rackham [facility]  If 7PM-7AM, please contact night-coverage [email or web address] 08/14/2021, 12:48 PM

## 2021-08-14 NOTE — Progress Notes (Addendum)
    Subjective:  Patient reports pain as mild to moderate.  Denies N/V/CP/SOB/Abd pain. Her Son and daughter-in-law are at bedside. She is not reporting much pain. She stated that she walked with her walker some today and can move her foot a little. She denies tingling and numbness in LE bilaterally.   Objective:   VITALS:   Vitals:   08/13/21 2230 08/14/21 0209 08/14/21 0455 08/14/21 0934  BP: 129/86 121/62 (!) 110/50 118/60  Pulse: 93 88 93 99  Resp: 17 17 17    Temp: 98 F (36.7 C) 98.1 F (36.7 C) 97.6 F (36.4 C) 98.1 F (36.7 C)  TempSrc: Oral Oral Oral Oral  SpO2: 100% 96% 95%   Weight:      Height:        Patient sitting in recliner. NAD. Son and daughter-in-law at bedside. Patient is alert and oriented to name, and can answer questions.  ABD soft Neurovascular intact Sensation intact distally Intact pulses distally Dorsiflexion/Plantar flexion intact No cellulitis present Compartment soft Mepilex dressings C/D/I.  She has mild pain in the leg.   Lab Results  Component Value Date   WBC 13.7 (H) 08/14/2021   HGB 10.8 (L) 08/14/2021   HCT 32.4 (L) 08/14/2021   MCV 91.8 08/14/2021   PLT 206 08/14/2021   BMET    Component Value Date/Time   NA 139 08/14/2021 0353   K 4.2 08/14/2021 0353   CL 108 08/14/2021 0353   CO2 24 08/14/2021 0353   GLUCOSE 208 (H) 08/14/2021 0353   BUN 28 (H) 08/14/2021 0353   CREATININE 0.98 08/14/2021 0353   CALCIUM 8.4 (L) 08/14/2021 0353   GFRNONAA 52 (L) 08/14/2021 0353     Assessment/Plan: 1 Day Post-Op   Principal Problem:   Femoral neck fracture (HCC) Active Problems:   Primary hypertension   Hypothyroidism   AKI (acute kidney injury) (HCC)   Leukocytosis   Hyperglycemia   Cognitive impairment   History of osteoporosis   Vitamin D deficiency   Acute blood loss anemia   WBAT with walker DVT ppx: Lovenox, SCDs, TEDS PO pain control PT/OT: She ambulated with 34 feet today. Continue PT and OT.  Dispo:  Disposition per medical team. Likely d/c to SNF per chart. Pain medication and DVT ppx printed in chart.     10/15/2021, PA-C 08/14/2021, 3:23 PM  [facility]  [place] [address], [address], [place], [place] Phone: [phone] [email or web address] Facebook  [phone]

## 2021-08-15 DIAGNOSIS — S72002A Fracture of unspecified part of neck of left femur, initial encounter for closed fracture: Secondary | ICD-10-CM | POA: Diagnosis not present

## 2021-08-15 DIAGNOSIS — D62 Acute posthemorrhagic anemia: Secondary | ICD-10-CM | POA: Diagnosis not present

## 2021-08-15 DIAGNOSIS — R4189 Other symptoms and signs involving cognitive functions and awareness: Secondary | ICD-10-CM | POA: Diagnosis not present

## 2021-08-15 DIAGNOSIS — N179 Acute kidney failure, unspecified: Secondary | ICD-10-CM | POA: Diagnosis not present

## 2021-08-15 LAB — IRON AND TIBC
Iron: 37 ug/dL (ref 28–170)
Saturation Ratios: 27 % (ref 10.4–31.8)
TIBC: 136 ug/dL — ABNORMAL LOW (ref 250–450)
UIBC: 99 ug/dL

## 2021-08-15 LAB — MAGNESIUM: Magnesium: 2.7 mg/dL — ABNORMAL HIGH (ref 1.7–2.4)

## 2021-08-15 LAB — FOLATE: Folate: 6.6 ng/mL (ref >5.9)

## 2021-08-15 LAB — CBC
HCT: 29.7 % — ABNORMAL LOW (ref 36.0–46.0)
Hemoglobin: 9.7 g/dL — ABNORMAL LOW (ref 12.0–15.0)
MCH: 30.6 pg (ref 26.0–34.0)
MCHC: 32.7 g/dL (ref 30.0–36.0)
MCV: 93.7 fL (ref 80.0–100.0)
Platelets: 199 10*3/uL (ref 150–400)
RBC: 3.17 MIL/uL — ABNORMAL LOW (ref 3.87–5.11)
RDW: 13.8 % (ref 11.5–15.5)
WBC: 13.1 10*3/uL — ABNORMAL HIGH (ref 4.0–10.5)
nRBC: 0.2 % (ref 0.0–0.2)

## 2021-08-15 LAB — RENAL FUNCTION PANEL
Albumin: 2.6 g/dL — ABNORMAL LOW (ref 3.5–5.0)
Anion gap: 6 (ref 5–15)
BUN: 42 mg/dL — ABNORMAL HIGH (ref 8–23)
CO2: 22 mmol/L (ref 22–32)
Calcium: 8.1 mg/dL — ABNORMAL LOW (ref 8.9–10.3)
Chloride: 109 mmol/L (ref 98–111)
Creatinine, Ser: 1.01 mg/dL — ABNORMAL HIGH (ref 0.44–1.00)
GFR, Estimated: 50 mL/min — ABNORMAL LOW (ref >60)
Glucose, Bld: 111 mg/dL — ABNORMAL HIGH (ref 70–99)
Phosphorus: 2.1 mg/dL — ABNORMAL LOW (ref 2.5–4.6)
Potassium: 3.7 mmol/L (ref 3.5–5.1)
Sodium: 137 mmol/L (ref 135–145)

## 2021-08-15 LAB — RETICULOCYTES
Immature Retic Fract: 7.7 % (ref 2.3–15.9)
RBC.: 3.19 MIL/uL — ABNORMAL LOW (ref 3.87–5.11)
Retic Count, Absolute: 68.9 10*3/uL (ref 19.0–186.0)
Retic Ct Pct: 2.2 % (ref 0.4–3.1)

## 2021-08-15 LAB — VITAMIN B12: Vitamin B-12: 91 pg/mL — ABNORMAL LOW (ref 180–914)

## 2021-08-15 LAB — FERRITIN: Ferritin: 96 ng/mL (ref 11–307)

## 2021-08-15 MED ORDER — AMLODIPINE BESYLATE 10 MG PO TABS: 10.0000 mg | ORAL_TABLET | Freq: Every day | ORAL | 11 refills | Status: AC

## 2021-08-15 MED ORDER — FLEET ENEMA 7-19 GM/118ML RE ENEM: 1.0000 | ENEMA | Freq: Every day | RECTAL | 0 refills | Status: DC | PRN

## 2021-08-15 MED ORDER — FERROUS SULFATE 325 (65 FE) MG PO TABS: 325.0000 mg | ORAL_TABLET | Freq: Two times a day (BID) | ORAL | 3 refills | Status: DC

## 2021-08-15 MED ORDER — ENSURE ENLIVE PO LIQD: 237.0000 mL | Freq: Two times a day (BID) | ORAL | 12 refills | Status: DC

## 2021-08-15 MED ORDER — VITAMIN D (ERGOCALCIFEROL) 1.25 MG (50000 UNIT) PO CAPS: 50000.0000 [IU] | ORAL_CAPSULE | ORAL | 0 refills | Status: DC

## 2021-08-15 MED ORDER — SENNOSIDES-DOCUSATE SODIUM 8.6-50 MG PO TABS: 1.0000 | ORAL_TABLET | Freq: Two times a day (BID) | ORAL | 0 refills | Status: DC | PRN

## 2021-08-15 MED ORDER — DOCUSATE SODIUM 100 MG PO CAPS: 100.0000 mg | ORAL_CAPSULE | Freq: Two times a day (BID) | ORAL | 0 refills | Status: DC

## 2021-08-15 NOTE — TOC Transition Note (Signed)
Transition of Care [facility]) - CM/SW Discharge Note   Patient Details  Name: Jennifer Gallegos MRN: [id number] Date of Birth: 08-18-1919  Transition of Care [facility]) CM/SW Contact:  Amada Jupiter, LCSW Phone Number: 08/15/2021, 1:31 PM   Clinical Narrative:    Pt medically cleared for discharge today and SN[facility] bed accepted [facility].  PTAR called at 12:30pm.  RN to call report to [phone]. No further TOC needs.   Final next level of care: Skilled Nursing Facility Barriers to Discharge: Barriers Resolved   Patient Goals and CMS Choice Patient states their goals for this hospitalization and ongoing recovery are:: [place]      Discharge Placement PASRR number recieved: 08/14/21            Patient chooses bed at: [facility] Patient to be transferred to facility by: PTAR Name of family member notified: sons Patient and family notified of of transfer: 08/15/21  Discharge Plan and Services In-house Referral: Clinical Social Work   Post Acute Care Choice: Skilled Nursing Facility          DME Arranged: N/A DME Agency: NA                  Social Determinants of Health (SDOH) Interventions     Readmission Risk Interventions    08/12/2021    3:14 PM  Readmission Risk Prevention Plan  Post Dischage Appt Complete  Medication Screening Complete  Transportation Screening Complete

## 2021-08-15 NOTE — Plan of Care (Signed)
  Problem: Activity: Goal: Risk for activity intolerance will decrease Outcome: Progressing   Problem: Nutrition: Goal: Adequate nutrition will be maintained Outcome: Progressing   Problem: Coping: Goal: Level of anxiety will decrease Outcome: Progressing   Problem: Pain Managment: Goal: General experience of comfort will improve Outcome: Progressing   

## 2021-08-15 NOTE — Progress Notes (Signed)
    Subjective:  Patient reports pain as mild to moderate.  Denies N/V/CP/SOB/Abd pain. She currently reports mild pain in her thigh but she states its not too bad. She denies tingling and numbness in LE bilaterally.   Objective:   VITALS:   Vitals:   08/14/21 1557 08/14/21 1739 08/14/21 2103 08/15/21 0642  BP: 133/63 129/65 136/67 140/63  Pulse: (!) 104 94 93 87  Resp:  18 17 17   Temp: (!) 97.5 F (36.4 C) 97.6 F (36.4 C) 98.3 F (36.8 C) 99.1 F (37.3 C)  TempSrc: Oral Oral Oral Oral  SpO2: 92% 90% 92% 92%  Weight:      Height:        Patient is lying in bed sleeping. Woke her up for her exam. She is alert and oriented to person and place but not time. NAD.  ABD soft Neurovascular intact Sensation intact distally Intact pulses distally Dorsiflexion/Plantar flexion intact No cellulitis present Compartment soft Mepilex dressings C/D/I.   Lab Results  Component Value Date   WBC 13.1 (H) 08/15/2021   HGB 9.7 (L) 08/15/2021   HCT 29.7 (L) 08/15/2021   MCV 93.7 08/15/2021   PLT 199 08/15/2021   BMET    Component Value Date/Time   NA 137 08/15/2021 0407   K 3.7 08/15/2021 0407   CL 109 08/15/2021 0407   CO2 22 08/15/2021 0407   GLUCOSE 111 (H) 08/15/2021 0407   BUN 42 (H) 08/15/2021 0407   CREATININE 1.01 (H) 08/15/2021 0407   CALCIUM 8.1 (L) 08/15/2021 0407   GFRNONAA 50 (L) 08/15/2021 0407     Assessment/Plan: 2 Days Post-Op   Principal Problem:   Femoral neck fracture (HCC) Active Problems:   Primary hypertension   Hypothyroidism   AKI (acute kidney injury) (HCC)   Leukocytosis   Hyperglycemia   Cognitive impairment   History of osteoporosis   Vitamin D deficiency   Acute blood loss anemia   WBAT with walker DVT ppx: Lovenox, SCDs, TEDS PO pain control PT/OT: Patient ambulated with PT 28 feet yesterday. Continue PT and OT.  Dispo: Disposition per medical team. Likely d/c to SNF per chart. Pain medication and DVT ppx printed in chart.      10/16/2021, PA-C 08/15/2021, 7:11 AM  [facility]  [facility] [address], [address], [place], [place] Phone: [phone] [email or web address] Facebook  [phone]

## 2021-08-15 NOTE — Progress Notes (Signed)
Called Jennifer Gallegos to give report on hold, not able to talk to a nurse. Will call back.

## 2021-08-15 NOTE — Discharge Summary (Signed)
Physician Discharge Summary  Jennifer Gallegos EAV:[id number] DOB: [birth year] DOA: 08/11/2021  PCP: Andreas Blower., MD  Admit date: 08/11/2021 Discharge date: 08/15/2021 Admitted From: ALF Disposition: SNF Recommendations for Outpatient Follow-up:  Follow ups as below. Please obtain CMP and CBC in 1 week Please follow up on the following pending results: None   Discharge Condition: Stable CODE STATUS: DNR/DNI  Follow-up Information     Swinteck, Arlys John, MD. Schedule an appointment as soon as possible for a visit in 2 week(s).   Specialty: Orthopedic Surgery Why: For wound re-check, For suture removal Contact information: [address] [place] [address] [phone]                 Hospital course 88 year old F with PMH of HTN, hypothyroidism and bladder pain presenting with left hip pain after she stepped off the curb  and fell, and admitted for left femoral neck fracture.  Orthopedic surgery consulted.   Patient underwent intramedullary fixation of left femoral fracture by Dr. Linna Caprice on 08/13/2021.  No postoperative complication. Therapy recommended SNF.  She will be on low-dose aspirin for VTE prophylaxis.  Outpatient follow-up with orthopedic surgery as above.  See individual problem list below for more.   Problems addressed during this hospitalization Accidental fall-history of gait abnormality Left femoral neck fracture due to accidental fall and osteoporosis Vitamin D deficiency History of osteoporosis -CT head/cervical spine without acute finding. -S/p intramedullary fixation of left femoral fracture by Dr. Linna Caprice on 7/5 -Norco for pain and aspirin 81 mg twice daily for VTE prophylaxis per orthopedic surgery -Bowel regimen for constipation -Started p.o. vitamin D 50,000 international unit weekly -Continue PT/OT -Outpatient follow-up with orthopedic surgery as above   Delirium: She has impaired hearing.   -Ensure she has had hearing  aids -Reorientation and delirium precautions. -Minimize or avoid sedating medications   Acute blood loss anemia: Surgical EBL ~100 cc.  No obvious bleed or hematoma.  Some element of hemodilution from IVF.  Recent Labs    08/11/21 1608 08/12/21 0324 08/13/21 0347 08/14/21 0353 08/15/21 0407  HGB 14.2 13.2 12.9 10.8* 9.7*  -Recheck CBC in 1 week   AKI: CKD ruled out. Recent Labs    08/11/21 1608 08/12/21 0324 08/13/21 0347 08/14/21 0353 08/15/21 0407  BUN 32* 28* 23 28* 42*  CREATININE 1.11* 0.98 0.84 0.98 1.01*  -Discontinue benazepril -Recheck at follow-up -Ensure good hydration   Essential hypertension: Normotensive. -Discontinue benazepril -Continue amlodipine   Hypothyroidism -Continue home Synthroid   History of bladder pain?  Does not seem to be on medication. Leukocytosis/bandemia: Likely demargination.  Improved.  Recheck CBC in 1 week   Goal of care counseling: DNR/DNI appropriate.    Increased nutrient needs Nutrition Problem: Increased nutrient needs Etiology: acute illness Signs/Symptoms: estimated needs Interventions: Ensure Enlive (each supplement provides 350kcal and 20 grams of protein), MVI, Liberalize Diet     Vital signs Vitals:   08/14/21 1557 08/14/21 1739 08/14/21 2103 08/15/21 0642  BP: 133/63 129/65 136/67 140/63  Pulse: (!) 104 94 93 87  Temp: (!) 97.5 F (36.4 C) 97.6 F (36.4 C) 98.3 F (36.8 C) 99.1 F (37.3 C)  Resp:  18 17 17   Height:      Weight:      SpO2: 92% 90% 92% 92%  TempSrc: Oral Oral Oral Oral  BMI (Calculated):         Discharge exam  GENERAL: No apparent distress.  Nontoxic. HEENT: MMM.  Vision and hearing grossly  intact.  NECK: Supple.  No apparent JVD.  RESP:  No IWOB.  Fair aeration bilaterally. CVS:  RRR. Heart sounds normal.  ABD/GI/GU: BS+. Abd soft, NTND.  MSK/EXT:  Moves extremities.  Significant muscle mass and subcu fat loss. SKIN: Dressing over right hip DCI. NEURO: Awake and alert.  Oriented to self, place and person.  No apparent focal neuro deficit. PSYCH: Calm. Normal affect.   Discharge Instructions Discharge Instructions     Diet general   Complete by: As directed    Discharge wound care:   Complete by: As directed     Reinforce dressing   Increase activity slowly   Complete by: As directed       Allergies as of 08/15/2021       Reactions   Penicillins    Did it involve swelling of the face/tongue/throat, SOB, or low BP? Unknown Did it involve sudden or severe rash/hives, skin peeling, or any reaction on the inside of your mouth or nose? Unknown Did you need to seek medical attention at a hospital or doctor's office? Unknown When did it last happen?  unknown     If all above answers are "NO", may proceed with cephalosporin use.   Macrobid [nitrofurantoin] Rash   Sulfa Antibiotics Rash        Medication List     STOP taking these medications    amLODipine-benazepril 10-20 MG capsule Commonly known as: LOTREL       TAKE these medications    amLODipine 10 MG tablet Commonly known as: NORVASC Take 1 tablet (10 mg total) by mouth daily.   aspirin 81 MG chewable tablet Commonly known as: Aspirin Childrens Chew 1 tablet (81 mg total) by mouth 2 (two) times daily with a meal.   docusate sodium 100 MG capsule Commonly known as: COLACE Take 1 capsule (100 mg total) by mouth 2 (two) times daily.   feeding supplement Liqd Take 237 mLs by mouth 2 (two) times daily between meals.   ferrous sulfate 325 (65 FE) MG tablet Take 1 tablet (325 mg total) by mouth 2 (two) times daily with a meal.   HYDROcodone-acetaminophen 10-325 MG tablet Commonly known as: Norco Take 0.5 tablets by mouth every 4 (four) hours as needed for up to 7 days for moderate pain or severe pain.   levothyroxine 88 MCG tablet Commonly known as: SYNTHROID Take 88 mcg by mouth daily before breakfast.   senna-docusate 8.6-50 MG tablet Commonly known as: Senokot-S Take 1  tablet by mouth 2 (two) times daily between meals as needed for moderate constipation.   sodium phosphate 7-19 GM/118ML Enem Place 133 mLs (1 enema total) rectally daily as needed for severe constipation.   Vitamin D (Ergocalciferol) 1.25 MG (50000 UNIT) Caps capsule Commonly known as: DRISDOL Take 1 capsule (50,000 Units total) by mouth every 7 (seven) days. Start taking on: August 21, 2021               Discharge Care Instructions  (From admission, onward)           Start     Ordered   08/15/21 0000  Discharge wound care:       Comments:  Reinforce dressing   08/15/21 1034            Consultations: Orthopedic surgery  Procedures/Studies: 7/5-intramedullary fixation of left femoral fracture   Pelvis Portable  Result Date: 08/13/2021 CLINICAL DATA:  Postop. EXAM: PORTABLE PELVIS 1-2 VIEWS COMPARISON:  Preoperative imaging. FINDINGS: Short intramedullary  nail with trans trochanteric and distal locking screw fixation of intertrochanteric femur fracture. The fracture is in improved alignment from preoperative imaging. Recent postsurgical change includes air and edema in the soft tissues. Lateral skin staples in place. IMPRESSION: ORIF of intertrochanteric femur fracture, no immediate postoperative complication. Electronically Signed   By: Narda Rutherford M.D.   On: 08/13/2021 18:49   DG HIP UNILAT WITH PELVIS 2-3 VIEWS LEFT  Result Date: 08/13/2021 CLINICAL DATA:  Left femur IM nail. EXAM: DG HIP (WITH OR WITHOUT PELVIS) 2-3V LEFT COMPARISON:  Preoperative imaging. FINDINGS: Five fluoroscopic spot views of the left hip obtained in the operating room. Short intramedullary rod with trans trochanteric and distal locking screw fixation traverse intertrochanteric femur fracture. Fluoroscopy time 38 seconds. Dose 3.8291 mGy. IMPRESSION: Fluoroscopic spot views during left proximal femur fracture ORIF. Electronically Signed   By: Narda Rutherford M.D.   On: 08/13/2021 18:48   DG  C-Arm 1-60 Min-No Report  Result Date: 08/13/2021 Fluoroscopy was utilized by the requesting physician.  No radiographic interpretation.   DG C-Arm 1-60 Min-No Report  Result Date: 08/13/2021 Fluoroscopy was utilized by the requesting physician.  No radiographic interpretation.   CT HIP LEFT WO CONTRAST  Result Date: 08/12/2021 CLINICAL DATA:  Left hip fracture EXAM: CT OF THE LEFT HIP WITHOUT CONTRAST TECHNIQUE: Multidetector CT imaging of the left hip was performed according to the standard protocol. Multiplanar CT image reconstructions were also generated. RADIATION DOSE REDUCTION: This exam was performed according to the departmental dose-optimization program which includes automated exposure control, adjustment of the mA and/or kV according to patient size and/or use of iterative reconstruction technique. COMPARISON:  Same-day x-ray FINDINGS: Bones/Joint/Cartilage Acute mildly comminuted fracture of the proximal left femur with basicervical and intertrochanteric components. Prominent anterior apex and [place] angulation. Left hip joint alignment maintained without dislocation. Moderate left hip osteoarthritis with joint space narrowing and marginal osteophyte formation. Old healed fracture deformities of the left pubic rami. Pubic symphysis and left SI joint intact without diastasis. Bones are diffusely demineralized. No evidence of a lytic or sclerotic bone lesion. Ligaments Suboptimally assessed by CT. Muscles and Tendons Gluteal muscle atrophy. No acute musculotendinous abnormality by CT. Soft tissues Mild soft tissue edema along the lateral aspect of the left hip. Small probable hematoma superficial to the distal left gluteus medius muscle measuring approximately 2.9 x 1.6 cm (series 5, image 23) IMPRESSION: 1. Acute moderately angulated fracture of the proximal left femur, as above. 2. Moderate left hip osteoarthritis. 3. Small probable hematoma superficial to the distal left gluteus medius muscle  measuring approximately 2.9 x 1.6 cm. Electronically Signed   By: Duanne Guess D.O.   On: 08/12/2021 12:29   CT HEAD WO CONTRAST  Result Date: 08/11/2021 CLINICAL DATA:  Head trauma, moderate-severe; Polytrauma, blunt EXAM: CT HEAD WITHOUT CONTRAST CT CERVICAL SPINE WITHOUT CONTRAST TECHNIQUE: Multidetector CT imaging of the head and cervical spine was performed following the standard protocol without intravenous contrast. Multiplanar CT image reconstructions of the cervical spine were also generated. RADIATION DOSE REDUCTION: This exam was performed according to the departmental dose-optimization program which includes automated exposure control, adjustment of the mA and/or kV according to patient size and/or use of iterative reconstruction technique. COMPARISON:  01/30/2013, 12/06/2012 FINDINGS: CT HEAD FINDINGS Brain: No evidence of acute infarction, hemorrhage, hydrocephalus, extra-axial collection or mass lesion/mass effect. Extensive low-density changes within the periventricular and subcortical white matter compatible with chronic microvascular ischemic change. Moderate diffuse cerebral volume loss. Vascular: Atherosclerotic calcifications involving  the large vessels of the skull base. No unexpected hyperdense vessel. Skull: Normal. Negative for fracture or focal lesion. Sinuses/Orbits: No acute finding. Other: Negative for scalp hematoma. CT CERVICAL SPINE FINDINGS Alignment: Facet joints are aligned without dislocation or traumatic listhesis. Dens and lateral masses are aligned. Skull base and vertebrae: No acute fracture. No primary bone lesion or focal pathologic process. Osseous structures are diffusely demineralized. Soft tissues and spinal canal: No prevertebral fluid or swelling. No visible canal hematoma. Disc levels:  Mild multilevel cervical spondylosis. Upper chest: Included lung apices are clear. Other: Aortic and bilateral carotid atherosclerosis. IMPRESSION: 1. No acute intracranial  abnormality. 2. Chronic microvascular ischemic change and moderate diffuse cerebral volume loss. 3. No acute cervical spine fracture or subluxation. 4. Mild multilevel cervical spondylosis. Aortic Atherosclerosis (ICD10-I70.0). Electronically Signed   By: Duanne Guess D.O.   On: 08/11/2021 16:12   CT CERVICAL SPINE WO CONTRAST  Result Date: 08/11/2021 CLINICAL DATA:  Head trauma, moderate-severe; Polytrauma, blunt EXAM: CT HEAD WITHOUT CONTRAST CT CERVICAL SPINE WITHOUT CONTRAST TECHNIQUE: Multidetector CT imaging of the head and cervical spine was performed following the standard protocol without intravenous contrast. Multiplanar CT image reconstructions of the cervical spine were also generated. RADIATION DOSE REDUCTION: This exam was performed according to the departmental dose-optimization program which includes automated exposure control, adjustment of the mA and/or kV according to patient size and/or use of iterative reconstruction technique. COMPARISON:  01/30/2013, 12/06/2012 FINDINGS: CT HEAD FINDINGS Brain: No evidence of acute infarction, hemorrhage, hydrocephalus, extra-axial collection or mass lesion/mass effect. Extensive low-density changes within the periventricular and subcortical white matter compatible with chronic microvascular ischemic change. Moderate diffuse cerebral volume loss. Vascular: Atherosclerotic calcifications involving the large vessels of the skull base. No unexpected hyperdense vessel. Skull: Normal. Negative for fracture or focal lesion. Sinuses/Orbits: No acute finding. Other: Negative for scalp hematoma. CT CERVICAL SPINE FINDINGS Alignment: Facet joints are aligned without dislocation or traumatic listhesis. Dens and lateral masses are aligned. Skull base and vertebrae: No acute fracture. No primary bone lesion or focal pathologic process. Osseous structures are diffusely demineralized. Soft tissues and spinal canal: No prevertebral fluid or swelling. No visible canal  hematoma. Disc levels:  Mild multilevel cervical spondylosis. Upper chest: Included lung apices are clear. Other: Aortic and bilateral carotid atherosclerosis. IMPRESSION: 1. No acute intracranial abnormality. 2. Chronic microvascular ischemic change and moderate diffuse cerebral volume loss. 3. No acute cervical spine fracture or subluxation. 4. Mild multilevel cervical spondylosis. Aortic Atherosclerosis (ICD10-I70.0). Electronically Signed   By: Duanne Guess D.O.   On: 08/11/2021 16:12   DG Knee Left Port  Result Date: 08/11/2021 CLINICAL DATA:  Fall. EXAM: PORTABLE LEFT KNEE - 1-2 VIEW COMPARISON:  Left knee x-rays dated September 19, 2015. FINDINGS: No acute fracture or dislocation. No joint effusion. Mild tricompartmental degenerative changes. Osteopenia. Soft tissues are unremarkable. IMPRESSION: 1. No acute osseous abnormality. 2. Mild tricompartmental osteoarthritis. Electronically Signed   By: Obie Dredge M.D.   On: 08/11/2021 15:39   DG Chest Port 1 View  Result Date: 08/11/2021 CLINICAL DATA:  Trauma. EXAM: PORTABLE CHEST 1 VIEW COMPARISON:  Chest radiograph 02/26/2013. FINDINGS: Streaky left basilar opacities. No visible pneumothorax or pleural effusions. Biapical pleuroparenchymal scarring. Mild enlargement the cardiac silhouette. No displaced fracture identified. Polyarticular degenerative change. IMPRESSION: Streaky left basilar opacities, which could represent atelectasis (favored), aspiration, and/or pneumonia. Electronically Signed   By: Feliberto Harts M.D.   On: 08/11/2021 15:34   DG Hip Unilat With Pelvis 2-3 Views Left  Result Date: 08/11/2021 CLINICAL DATA:  Fall EXAM: DG HIP (WITH OR WITHOUT PELVIS) 2-3V LEFT COMPARISON:  None Available. FINDINGS: Significantly displaced fracture of the LEFT femoral neck, intertrochanteric region, with at least some degree of impaction at the fracture site and with nearly 90% angulation deformity at the fracture site. Femoral head remains  grossly well positioned relative to the acetabulum. Probable old healed fracture of the LEFT symphysis pubis. IMPRESSION: LEFT femoral neck fracture, as detailed above. Electronically Signed   By: Franki Cabot M.D.   On: 08/11/2021 15:32       The results of significant diagnostics from this hospitalization (including imaging, microbiology, ancillary and laboratory) are listed below for reference.     Microbiology: Recent Results (from the past 240 hour(s))  Resp Panel by RT-PCR (Flu A&B, Covid) Anterior Nasal Swab     Status: None   Collection Time: 08/11/21  4:08 PM   Specimen: Anterior Nasal Swab  Result Value Ref Range Status   SARS Coronavirus 2 by RT PCR NEGATIVE NEGATIVE Final    Comment: (NOTE) SARS-CoV-2 target nucleic acids are NOT DETECTED.  The SARS-CoV-2 RNA is generally detectable in upper respiratory specimens during the acute phase of infection. The lowest concentration of SARS-CoV-2 viral copies this assay can detect is 138 copies/mL. A negative result does not preclude SARS-Cov-2 infection and should not be used as the sole basis for treatment or other patient management decisions. A negative result may occur with  improper specimen collection/handling, submission of specimen other than nasopharyngeal swab, presence of viral mutation(s) within the areas targeted by this assay, and inadequate number of viral copies(<138 copies/mL). A negative result must be combined with clinical observations, patient history, and epidemiological information. The expected result is Negative.  Fact Sheet for Patients:  EntrepreneurPulse.com.au  Fact Sheet for Healthcare Providers:  IncredibleEmployment.be  This test is no t yet approved or cleared by the [place] FDA and  has been authorized for detection and/or diagnosis of SARS-CoV-2 by FDA under an Emergency Use Authorization (EUA). This EUA will remain  in effect (meaning this test  can be used) for the duration of the COVID-19 declaration under Section 564(b)(1) of the Act, 21 U.S.C.section 360bbb-3(b)(1), unless the authorization is terminated  or revoked sooner.       Influenza A by PCR NEGATIVE NEGATIVE Final   Influenza B by PCR NEGATIVE NEGATIVE Final    Comment: (NOTE) The Xpert Xpress SARS-CoV-2/FLU/RSV plus assay is intended as an aid in the diagnosis of influenza from Nasopharyngeal swab specimens and should not be used as a sole basis for treatment. Nasal washings and aspirates are unacceptable for Xpert Xpress SARS-CoV-2/FLU/RSV testing.  Fact Sheet for Patients: EntrepreneurPulse.com.au  Fact Sheet for Healthcare Providers: IncredibleEmployment.be  This test is not yet approved or cleared by the [place] FDA and has been authorized for detection and/or diagnosis of SARS-CoV-2 by FDA under an Emergency Use Authorization (EUA). This EUA will remain in effect (meaning this test can be used) for the duration of the COVID-19 declaration under Section 564(b)(1) of the Act, 21 U.S.C. section 360bbb-3(b)(1), unless the authorization is terminated or revoked.  Performed at [facility], [place] [address], [place], [place] [address]   Surgical PCR screen     Status: Abnormal   Collection Time: 08/11/21 10:09 PM   Specimen: Nasal Mucosa; Nasal Swab  Result Value Ref Range Status   MRSA, PCR NEGATIVE NEGATIVE Final   Staphylococcus aureus POSITIVE (A) NEGATIVE Final  Comment: (NOTE) The Xpert SA Assay (FDA approved for NASAL specimens in patients 31 years of age and older), is one component of a comprehensive surveillance program. It is not intended to diagnose infection nor to guide or monitor treatment. Performed at [facility], [place] [address], [place], [place] [address]      Labs:  CBC: Recent Labs  Lab 08/11/21 1608 08/12/21 0324 08/13/21 0347 08/14/21 0353  08/15/21 0407  WBC 15.6* 17.6* 21.6* 13.7* 13.1*  NEUTROABS 13.0*  --  17.3*  --   --   HGB 14.2 13.2 12.9 10.8* 9.7*  HCT 42.8 40.0 39.1 32.4* 29.7*  MCV 92.4 92.0 92.2 91.8 93.7  PLT 275 242 250 206 199   BMP &GFR Recent Labs  Lab 08/11/21 1608 08/12/21 0324 08/13/21 0347 08/14/21 0353 08/15/21 0407  NA 141 140 138 139 137  K 3.7 4.1 3.5 4.2 3.7  CL 108 111 106 108 109  CO2 23 22 23 24 22   GLUCOSE 268* 210* 144* 208* 111*  BUN 32* 28* 23 28* 42*  CREATININE 1.11* 0.98 0.84 0.98 1.01*  CALCIUM 8.9 8.7* 9.0 8.4* 8.1*  MG  --   --  2.1 2.2 2.7*  PHOS  --   --  2.4* 4.2 2.1*   Estimated Creatinine Clearance: 21.8 mL/min (A) (by C-G formula based on SCr of 1.01 mg/dL (H)). Liver & Pancreas: Recent Labs  Lab 08/13/21 0347 08/14/21 0353 08/15/21 0407  AST 21  --   --   ALT 15  --   --   ALKPHOS 78  --   --   BILITOT 1.1  --   --   PROT 6.5  --   --   ALBUMIN 3.7 2.9* 2.6*   No results for input(s): "LIPASE", "AMYLASE" in the last 168 hours. No results for input(s): "AMMONIA" in the last 168 hours. Diabetic: No results for input(s): "HGBA1C" in the last 72 hours. No results for input(s): "GLUCAP" in the last 168 hours. Cardiac Enzymes: Recent Labs  Lab 08/14/21 0353  CKTOTAL 123   No results for input(s): "PROBNP" in the last 8760 hours. Coagulation Profile: Recent Labs  Lab 08/11/21 1608  INR 1.0   Thyroid Function Tests: Recent Labs    08/14/21 0353  TSH 1.011   Lipid Profile: No results for input(s): "CHOL", "HDL", "LDLCALC", "TRIG", "CHOLHDL", "LDLDIRECT" in the last 72 hours. Anemia Panel: Recent Labs    08/15/21 0407  VITAMINB12 91*  FOLATE 6.6  FERRITIN 96  TIBC 136*  IRON 37  RETICCTPCT 2.2   Urine analysis:    Component Value Date/Time   COLORURINE YELLOW (A) 08/11/2021 2209   APPEARANCEUR CLOUDY (A) 08/11/2021 2209   LABSPEC 1.014 08/11/2021 2209   PHURINE 5.0 08/11/2021 2209   GLUCOSEU >=500 (A) 08/11/2021 2209   HGBUR SMALL  (A) 08/11/2021 2209   BILIRUBINUR NEGATIVE 08/11/2021 2209   KETONESUR NEGATIVE 08/11/2021 2209   PROTEINUR 30 (A) 08/11/2021 2209   NITRITE NEGATIVE 08/11/2021 2209   LEUKOCYTESUR SMALL (A) 08/11/2021 2209   Sepsis Labs: Invalid input(s): "PROCALCITONIN", "LACTICIDVEN"   SIGNED:  Mercy Riding, MD  [facility] 08/15/2021, 11:37 AM

## 2021-08-15 NOTE — Plan of Care (Signed)
  Problem: Activity: Goal: Risk for activity intolerance will decrease Outcome: Progressing   Problem: Pain Managment: Goal: General experience of comfort will improve Outcome: Progressing   Problem: Safety: Goal: Ability to remain free from injury will improve Outcome: Progressing   

## 2021-08-15 NOTE — Progress Notes (Signed)
Physical Therapy Treatment Patient Details Name: Jennifer Gallegos MRN: [id number] DOB: 1919/02/17 Today's Date: 08/15/2021   History of Present Illness Pt s/p fall with L hip fx and now s/p IM nailing.    PT Comments    Pt continues very cooperative and progressing steadily with mobility.  This date, pt up to ambulate increased distance in hall and with decreasing level of assist for most tasks.   Recommendations for follow up therapy are one component of a multi-disciplinary discharge planning process, led by the attending physician.  Recommendations may be updated based on patient status, additional functional criteria and insurance authorization.  Follow Up Recommendations  Skilled nursing-short term rehab (<3 hours/day) Can patient physically be transported by private vehicle: No   Assistance Recommended at Discharge Frequent or constant Supervision/Assistance  Patient can return home with the following A lot of help with walking and/or transfers;A little help with bathing/dressing/bathroom;Assistance with cooking/housework;Assist for transportation;Help with stairs or ramp for entrance   Equipment Recommendations  Rolling walker (2 wheels)    Recommendations for Other Services       Precautions / Restrictions Precautions Precautions: Fall Precaution Comments: very HOH, has hearing aids Restrictions Weight Bearing Restrictions: No LLE Weight Bearing: Weight bearing as tolerated     Mobility  Bed Mobility Overal bed mobility: Needs Assistance Bed Mobility: Supine to Sit     Supine to sit: Min assist, Mod assist     General bed mobility comments: Increased time with cues for sequence and physical assist to manage L LE and to control trunk    Transfers Overall transfer level: Needs assistance Equipment used: Rolling walker (2 wheels) Transfers: Sit to/from Stand Sit to Stand: Min assist, Mod assist           General transfer comment: cues for LE management and use  of UEs to self assist    Ambulation/Gait Ambulation/Gait assistance: Min assist Gait Distance (Feet): 37 Feet Assistive device: Rolling walker (2 wheels) Gait Pattern/deviations: Step-to pattern, Step-through pattern, Decreased step length - right, Decreased step length - left, Shuffle, Trunk flexed, Antalgic, Decreased stance time - left Gait velocity: decr     General Gait Details: cues for sequence, posture and position from [employer]             Wheelchair Mobility    Modified Rankin (Stroke Patients Only)       Balance Overall balance assessment: Needs assistance Sitting-balance support: No upper extremity supported, Feet supported Sitting balance-Leahy Scale: Fair     Standing balance support: Bilateral upper extremity supported Standing balance-Leahy Scale: Poor                              Cognition Arousal/Alertness: Awake/alert Behavior During Therapy: WFL for tasks assessed/performed Overall Cognitive Status: Within Functional Limits for tasks assessed                                          Exercises      General Comments        Pertinent Vitals/Pain Pain Assessment Pain Assessment: Faces Faces Pain Scale: Hurts little more Pain Location: Hip Pain Descriptors / Indicators: Grimacing Pain Intervention(s): Limited activity within patient's tolerance, Monitored during session    Home Living  Prior Function            PT Goals (current goals can now be found in the care plan section) Acute Rehab PT Goals Patient Stated Goal: Regain IND PT Goal Formulation: With patient Time For Goal Achievement: 08/21/21 Potential to Achieve Goals: Good Progress towards PT goals: Progressing toward goals    Frequency    Min 4X/week      PT Plan Current plan remains appropriate    Co-evaluation              AM-PAC PT "6 Clicks" Mobility   Outcome Measure  Help  needed turning from your back to your side while in a flat bed without using bedrails?: A Lot Help needed moving from lying on your back to sitting on the side of a flat bed without using bedrails?: A Lot Help needed moving to and from a bed to a chair (including a wheelchair)?: A Lot Help needed standing up from a chair using your arms (e.g., wheelchair or bedside chair)?: A Lot Help needed to walk in hospital room?: A Little Help needed climbing 3-5 steps with a railing? : A Lot 6 Click Score: 13    End of Session Equipment Utilized During Treatment: Gait belt Activity Tolerance: Patient tolerated treatment well Patient left: in chair;with call bell/phone within reach;with family/visitor present Nurse Communication: Mobility status PT Visit Diagnosis: Unsteadiness on feet (R26.81);Difficulty in walking, not elsewhere classified (R26.2);Pain Pain - Right/Left: Left Pain - part of body: Hip     Time: 2229-7989 PT Time Calculation (min) (ACUTE ONLY): 20 min  Charges:  $Gait Training: 8-22 mins                     Mauro Kaufmann PT Acute Rehabilitation Services Pager [phone] Office [phone]    Troi Florendo 08/15/2021, 12:29 PM

## 2021-11-17 ENCOUNTER — Other Ambulatory Visit: Payer: Self-pay

## 2021-11-17 ENCOUNTER — Encounter (HOSPITAL_COMMUNITY): Payer: Self-pay

## 2021-11-17 ENCOUNTER — Emergency Department (HOSPITAL_COMMUNITY): Payer: Medicare Other

## 2021-11-17 ENCOUNTER — Emergency Department (HOSPITAL_COMMUNITY)
Admission: EM | Admit: 2021-11-17 | Discharge: 2021-11-17 | Disposition: A | Discharge status: Home / Self Care | Payer: Medicare Other | Attending: Emergency Medicine | Admitting: Emergency Medicine

## 2021-11-17 DIAGNOSIS — W19XXXA Unspecified fall, initial encounter: Secondary | ICD-10-CM | POA: Diagnosis not present

## 2021-11-17 DIAGNOSIS — M25551 Pain in right hip: Secondary | ICD-10-CM | POA: Insufficient documentation

## 2021-11-17 DIAGNOSIS — I6782 Cerebral ischemia: Secondary | ICD-10-CM | POA: Diagnosis not present

## 2021-11-17 LAB — URINALYSIS, ROUTINE W REFLEX MICROSCOPIC
Bilirubin Urine: NEGATIVE
Glucose, UA: NEGATIVE mg/dL
Ketones, ur: NEGATIVE mg/dL
Nitrite: NEGATIVE
Protein, ur: 100 mg/dL — AB
Specific Gravity, Urine: 1.013 (ref 1.005–1.030)
pH: 6 (ref 5.0–8.0)

## 2021-11-17 LAB — CBC WITH DIFFERENTIAL/PLATELET
Abs Immature Granulocytes: 0.11 10*3/uL — ABNORMAL HIGH (ref 0.00–0.07)
Basophils Absolute: 0.1 10*3/uL (ref 0.0–0.1)
Basophils Relative: 1 %
Eosinophils Absolute: 0.1 10*3/uL (ref 0.0–0.5)
Eosinophils Relative: 0 %
HCT: 49.5 % — ABNORMAL HIGH (ref 36.0–46.0)
Hemoglobin: 16.3 g/dL — ABNORMAL HIGH (ref 12.0–15.0)
Immature Granulocytes: 1 %
Lymphocytes Relative: 6 %
Lymphs Abs: 1.1 10*3/uL (ref 0.7–4.0)
MCH: 29.9 pg (ref 26.0–34.0)
MCHC: 32.9 g/dL (ref 30.0–36.0)
MCV: 90.7 fL (ref 80.0–100.0)
Monocytes Absolute: 0.8 10*3/uL (ref 0.1–1.0)
Monocytes Relative: 4 %
Neutro Abs: 17.6 10*3/uL — ABNORMAL HIGH (ref 1.7–7.7)
Neutrophils Relative %: 88 %
Platelets: 214 10*3/uL (ref 150–400)
RBC: 5.46 MIL/uL — ABNORMAL HIGH (ref 3.87–5.11)
RDW: 13.3 % (ref 11.5–15.5)
WBC: 19.7 10*3/uL — ABNORMAL HIGH (ref 4.0–10.5)
nRBC: 0 % (ref 0.0–0.2)

## 2021-11-17 LAB — CBG MONITORING, ED: Glucose-Capillary: 154 mg/dL — ABNORMAL HIGH (ref 70–99)

## 2021-11-17 MED ORDER — CEPHALEXIN 500 MG PO CAPS: ORAL_CAPSULE | ORAL | 0 refills | Status: DC

## 2021-11-17 NOTE — ED Provider Notes (Signed)
[facility]-EMERGENCY DEPT Provider Note   CSN: [id number] Arrival date & time: 11/17/21  1047     History  Chief Complaint  Patient presents with   Jennifer Gallegos    Emelin Dascenzo is a 88 y.o. female.  88 yo F with a chief complaints of a fall.  This was unwitnessed and noticed this morning.  The patient tells me that she fell going to church.  Per EMS report the patient likely had fallen out of bed.  Had no specific complaints.  Placed in a c-collar for precaution.        Home Medications Prior to Admission medications   Medication Sig Start Date End Date Taking? Authorizing Provider  cephALEXin (KEFLEX) 500 MG capsule 2 caps po bid x 7 days 11/17/21  Yes Melene Plan, DO  amLODipine (NORVASC) 10 MG tablet Take 1 tablet (10 mg total) by mouth daily. 08/15/21 08/15/22  Almon Hercules, MD  docusate sodium (COLACE) 100 MG capsule Take 1 capsule (100 mg total) by mouth 2 (two) times daily. 08/15/21   Almon Hercules, MD  feeding supplement (ENSURE ENLIVE / ENSURE PLUS) LIQD Take 237 mLs by mouth 2 (two) times daily between meals. 08/15/21   Almon Hercules, MD  ferrous sulfate 325 (65 FE) MG tablet Take 1 tablet (325 mg total) by mouth 2 (two) times daily with a meal. 08/15/21   Almon Hercules, MD  levothyroxine (SYNTHROID, LEVOTHROID) 88 MCG tablet Take 88 mcg by mouth daily before breakfast.    [provider]  senna-docusate (SENOKOT-S) 8.6-50 MG tablet Take 1 tablet by mouth 2 (two) times daily between meals as needed for moderate constipation. 08/15/21   Almon Hercules, MD  sodium phosphate (FLEET) 7-19 GM/118ML ENEM Place 133 mLs (1 enema total) rectally daily as needed for severe constipation. 08/15/21   Almon Hercules, MD  Vitamin D, Ergocalciferol, (DRISDOL) 1.25 MG (50000 UNIT) CAPS capsule Take 1 capsule (50,000 Units total) by mouth every 7 (seven) days. 08/21/21   Almon Hercules, MD      Allergies    Penicillins, Macrobid [nitrofurantoin], and Sulfa antibiotics    Review  of Systems   Review of Systems  Physical Exam Updated Vital Signs BP 136/85   Pulse (!) 104   Temp 97.6 F (36.4 C)   Resp (!) 25   SpO2 94%  Physical Exam Vitals and nursing note reviewed.  Constitutional:      General: She is not in acute distress.    Appearance: She is well-developed. She is not diaphoretic.  HENT:     Head: Normocephalic and atraumatic.  Eyes:     Pupils: Pupils are equal, round, and reactive to light.  Cardiovascular:     Rate and Rhythm: Normal rate and regular rhythm.     Heart sounds: No murmur heard.    No friction rub. No gallop.  Pulmonary:     Effort: Pulmonary effort is normal.     Breath sounds: No wheezing or rales.  Abdominal:     General: There is no distension.     Palpations: Abdomen is soft.     Tenderness: There is no abdominal tenderness.  Musculoskeletal:        General: Tenderness present.     Cervical back: Normal range of motion and neck supple.     Comments: Some pain with palpation along the right lateral hip.  No obvious pain with internal/external rotation.  Pelvis is stable.  Palpated from  head to toe without any other noted areas of bony tenderness.  No midline spinal tenderness step-offs or deformities.  Skin:    General: Skin is warm and dry.  Neurological:     Mental Status: She is alert and oriented to person, place, and time.  Psychiatric:        Behavior: Behavior normal.     ED Results / Procedures / Treatments   Labs (all labs ordered are listed, but only abnormal results are displayed) Labs Reviewed  CBC WITH DIFFERENTIAL/PLATELET - Abnormal; Notable for the following components:      Result Value   WBC 19.7 (*)    RBC 5.46 (*)    Hemoglobin 16.3 (*)    HCT 49.5 (*)    Neutro Abs 17.6 (*)    Abs Immature Granulocytes 0.11 (*)    All other components within normal limits  URINALYSIS, ROUTINE W REFLEX MICROSCOPIC - Abnormal; Notable for the following components:   APPearance HAZY (*)    Hgb urine  dipstick SMALL (*)    Protein, ur 100 (*)    Leukocytes,Ua TRACE (*)    Bacteria, UA MANY (*)    All other components within normal limits  CBG MONITORING, ED - Abnormal; Notable for the following components:   Glucose-Capillary 154 (*)    All other components within normal limits  CK  COMPREHENSIVE METABOLIC PANEL    EKG None  Radiology CT Head Wo Contrast  Result Date: 11/17/2021 CLINICAL DATA:  Unwitnessed fall, neck trauma. EXAM: CT HEAD WITHOUT CONTRAST CT CERVICAL SPINE WITHOUT CONTRAST TECHNIQUE: Multidetector CT imaging of the head and cervical spine was performed following the standard protocol without intravenous contrast. Multiplanar CT image reconstructions of the cervical spine were also generated. RADIATION DOSE REDUCTION: This exam was performed according to the departmental dose-optimization program which includes automated exposure control, adjustment of the mA and/or kV according to patient size and/or use of iterative reconstruction technique. COMPARISON:  Cervical spine CT dated 08/11/2021. FINDINGS: CT HEAD FINDINGS Brain: Generalized age related parenchymal volume loss with commensurate dilatation of the ventricles and sulci. Chronic small vessel ischemic changes within the bilateral periventricular and subcortical white matter regions. Additional old lacunar infarcts within the bilateral basal ganglia regions. No mass, hemorrhage, edema or other evidence of acute parenchymal abnormality. Vascular: Chronic calcified atherosclerotic changes of the large vessels at the skull base. No unexpected hyperdense vessel. Skull: Normal. Negative for fracture or focal lesion. Sinuses/Orbits: No acute finding. Other: None. CT CERVICAL SPINE FINDINGS Alignment: Stable.  No evidence of acute vertebral body subluxation. Skull base and vertebrae: No fracture line or displaced fracture fragment is seen. Soft tissues and spinal canal: No prevertebral fluid or swelling. No visible canal hematoma.  Disc levels: Degenerative spondylosis throughout the cervical spine, mild to moderate in degree, but with no more than mild central canal stenosis at any level. Upper chest: No acute findings. Other: None. IMPRESSION: 1. No acute intracranial abnormality. No intracranial mass, hemorrhage or edema. No skull fracture. 2. No fracture or acute subluxation within the cervical spine. Degenerative spondylosis throughout the cervical spine, mild to moderate in degree, but with no more than mild central canal stenosis at any level. Electronically Signed   By: Bary Richard M.D.   On: 11/17/2021 12:07   CT Cervical Spine Wo Contrast  Result Date: 11/17/2021 CLINICAL DATA:  Unwitnessed fall, neck trauma. EXAM: CT HEAD WITHOUT CONTRAST CT CERVICAL SPINE WITHOUT CONTRAST TECHNIQUE: Multidetector CT imaging of the head and cervical spine  was performed following the standard protocol without intravenous contrast. Multiplanar CT image reconstructions of the cervical spine were also generated. RADIATION DOSE REDUCTION: This exam was performed according to the departmental dose-optimization program which includes automated exposure control, adjustment of the mA and/or kV according to patient size and/or use of iterative reconstruction technique. COMPARISON:  Cervical spine CT dated 08/11/2021. FINDINGS: CT HEAD FINDINGS Brain: Generalized age related parenchymal volume loss with commensurate dilatation of the ventricles and sulci. Chronic small vessel ischemic changes within the bilateral periventricular and subcortical white matter regions. Additional old lacunar infarcts within the bilateral basal ganglia regions. No mass, hemorrhage, edema or other evidence of acute parenchymal abnormality. Vascular: Chronic calcified atherosclerotic changes of the large vessels at the skull base. No unexpected hyperdense vessel. Skull: Normal. Negative for fracture or focal lesion. Sinuses/Orbits: No acute finding. Other: None. CT CERVICAL  SPINE FINDINGS Alignment: Stable.  No evidence of acute vertebral body subluxation. Skull base and vertebrae: No fracture line or displaced fracture fragment is seen. Soft tissues and spinal canal: No prevertebral fluid or swelling. No visible canal hematoma. Disc levels: Degenerative spondylosis throughout the cervical spine, mild to moderate in degree, but with no more than mild central canal stenosis at any level. Upper chest: No acute findings. Other: None. IMPRESSION: 1. No acute intracranial abnormality. No intracranial mass, hemorrhage or edema. No skull fracture. 2. No fracture or acute subluxation within the cervical spine. Degenerative spondylosis throughout the cervical spine, mild to moderate in degree, but with no more than mild central canal stenosis at any level. Electronically Signed   By: Bary Richard M.D.   On: 11/17/2021 12:07   DG Chest 1 View  Result Date: 11/17/2021 CLINICAL DATA:  Unwitnessed fall this morning EXAM: CHEST  1 VIEW COMPARISON:  Radiographs 08/11/2021 FINDINGS: Stable mild enlargement of the cardiomediastinal silhouette. Aortic atherosclerotic calcification. Unchanged left basilar opacities since 08/11/2021. No definite pleural effusion or pneumothorax. Biapical scarring. Demineralization.  No definite displaced rib fractures. IMPRESSION: Unchanged left basilar opacities since 08/11/2021 likely representing atelectasis. Electronically Signed   By: Minerva Fester M.D.   On: 11/17/2021 11:59   DG Hip Unilat W or Wo Pelvis 2-3 Views Right  Result Date: 11/17/2021 CLINICAL DATA:  Hip pain post fall EXAM: DG HIP (WITH OR WITHOUT PELVIS) 2-3V RIGHT COMPARISON:  Radiograph 08/13/2021 FINDINGS: No evidence of acute fracture or dislocation. Intramedullary rod and nail fixation across a healing left intertrochanteric fracture. No radiographic evidence of loosening. Degenerative arthritis both hips. IMPRESSION: No acute fracture or dislocation. Electronically Signed   By: Minerva Fester M.D.   On: 11/17/2021 11:57    Procedures Procedures    Medications Ordered in ED Medications - No data to display  ED Course/ Medical Decision Making/ A&P                           Medical Decision Making Amount and/or Complexity of Data Reviewed Labs: ordered. Radiology: ordered.  Risk Prescription drug management.   88 yo F with a chief complaint of a fall.  Patient is confused and per EMS report this is normal for her.  She is unable to provide much history.  She has no obvious signs of trauma on external exam.  She is complaining of some right hip pain.  We will obtain a plain film of the right hip.  CT of the head and C-spine.  Basic blood work.  Trouble getting metabolic panel.  CBC  with no leukocytosis, appears to be a somewhat chronic for her.  Her urine could be infected.  Will start on antibiotics.  CT of the head and C-spine negative.  Plain film of the right hip independently interpreted by me without fracture.  She is able to ambulate without issue.  Will discharge home.  PCP follow-up.    4:34 PM:  I have discussed the diagnosis/risks/treatment options with the patient and family.  Evaluation and diagnostic testing in the emergency department does not suggest an emergent condition requiring admission or immediate intervention beyond what has been performed at this time.  They will follow up with PCP. We also discussed returning to the ED immediately if new or worsening sx occur. We discussed the sx which are most concerning (e.g., sudden worsening pain, fever, inability to tolerate by mouth) that necessitate immediate return. Medications administered to the patient during their visit and any new prescriptions provided to the patient are listed below.  Medications given during this visit Medications - No data to display   The patient appears reasonably screen and/or stabilized for discharge and I doubt any other medical condition or other [facility] requiring further  screening, evaluation, or treatment in the ED at this time prior to discharge.          Final Clinical Impression(s) / ED Diagnoses Final diagnoses:  Fall, initial encounter    Rx / DC Orders ED Discharge Orders          Ordered    cephALEXin (KEFLEX) 500 MG capsule        11/17/21 1631              Deno Etienne, DO 11/17/21 1634

## 2021-11-17 NOTE — ED Notes (Signed)
Pt ambulated w/o any difficulty or complaints.

## 2021-11-17 NOTE — ED Triage Notes (Signed)
Pt BIB ems from [facility] after unwitnessed fall. Unknown time of fall. Pt not on blood thinners, at base line.

## 2021-11-17 NOTE — Discharge Instructions (Signed)
Please call your family doctor and discuss your visit to the ED.  Let them know we had trouble getting your blood and see if they want to check it in the office.  Your urine could be infected, I was going to give you a course of antibiotics.  Please return for worsening symptoms.

## 2022-02-24 ENCOUNTER — Other Ambulatory Visit: Payer: Self-pay

## 2022-02-24 ENCOUNTER — Encounter (HOSPITAL_COMMUNITY): Payer: Self-pay

## 2022-02-24 ENCOUNTER — Emergency Department (HOSPITAL_COMMUNITY): Payer: Medicare Other

## 2022-02-24 ENCOUNTER — Emergency Department (HOSPITAL_COMMUNITY)
Admission: EM | Admit: 2022-02-24 | Discharge: 2022-02-24 | Disposition: A | Discharge status: Home / Self Care | Payer: Medicare Other | Attending: Emergency Medicine | Admitting: Emergency Medicine

## 2022-02-24 DIAGNOSIS — J984 Other disorders of lung: Secondary | ICD-10-CM | POA: Insufficient documentation

## 2022-02-24 DIAGNOSIS — R000 Tachycardia, unspecified: Secondary | ICD-10-CM | POA: Insufficient documentation

## 2022-02-24 DIAGNOSIS — R0902 Hypoxemia: Secondary | ICD-10-CM | POA: Diagnosis not present

## 2022-02-24 DIAGNOSIS — R600 Localized edema: Secondary | ICD-10-CM | POA: Insufficient documentation

## 2022-02-24 DIAGNOSIS — Z1152 Encounter for screening for COVID-19: Secondary | ICD-10-CM | POA: Insufficient documentation

## 2022-02-24 DIAGNOSIS — I517 Cardiomegaly: Secondary | ICD-10-CM | POA: Insufficient documentation

## 2022-02-24 DIAGNOSIS — Z66 Do not resuscitate: Secondary | ICD-10-CM | POA: Diagnosis not present

## 2022-02-24 DIAGNOSIS — R079 Chest pain, unspecified: Secondary | ICD-10-CM | POA: Diagnosis present

## 2022-02-24 LAB — COMPREHENSIVE METABOLIC PANEL
ALT: 10 U/L (ref 0–44)
AST: 19 U/L (ref 15–41)
Albumin: 3.5 g/dL (ref 3.5–5.0)
Alkaline Phosphatase: 100 U/L (ref 38–126)
Anion gap: 15 (ref 5–15)
BUN: 28 mg/dL — ABNORMAL HIGH (ref 8–23)
CO2: 19 mmol/L — ABNORMAL LOW (ref 22–32)
Calcium: 8.7 mg/dL — ABNORMAL LOW (ref 8.9–10.3)
Chloride: 105 mmol/L (ref 98–111)
Creatinine, Ser: 1.5 mg/dL — ABNORMAL HIGH (ref 0.44–1.00)
GFR, Estimated: 31 mL/min — ABNORMAL LOW (ref >60)
Glucose, Bld: 200 mg/dL — ABNORMAL HIGH (ref 70–99)
Potassium: 4.5 mmol/L (ref 3.5–5.1)
Sodium: 139 mmol/L (ref 135–145)
Total Bilirubin: 0.8 mg/dL (ref 0.3–1.2)
Total Protein: 6.1 g/dL — ABNORMAL LOW (ref 6.5–8.1)

## 2022-02-24 LAB — CBC
HCT: 46.9 % — ABNORMAL HIGH (ref 36.0–46.0)
Hemoglobin: 14.7 g/dL (ref 12.0–15.0)
MCH: 30.6 pg (ref 26.0–34.0)
MCHC: 31.3 g/dL (ref 30.0–36.0)
MCV: 97.7 fL (ref 80.0–100.0)
Platelets: 268 10*3/uL (ref 150–400)
RBC: 4.8 MIL/uL (ref 3.87–5.11)
RDW: 14.7 % (ref 11.5–15.5)
WBC: 18.8 10*3/uL — ABNORMAL HIGH (ref 4.0–10.5)
nRBC: 0 % (ref 0.0–0.2)

## 2022-02-24 LAB — RESP PANEL BY RT-PCR (RSV, FLU A&B, COVID)  RVPGX2
Influenza A by PCR: NEGATIVE
Influenza B by PCR: NEGATIVE
Resp Syncytial Virus by PCR: NEGATIVE
SARS Coronavirus 2 by RT PCR: NEGATIVE

## 2022-02-24 LAB — TROPONIN I (HIGH SENSITIVITY)
Troponin I (High Sensitivity): 27 ng/L — ABNORMAL HIGH (ref <18)
Troponin I (High Sensitivity): 29 ng/L — ABNORMAL HIGH (ref <18)

## 2022-02-24 LAB — BRAIN NATRIURETIC PEPTIDE: B Natriuretic Peptide: 356.2 pg/mL — ABNORMAL HIGH (ref 0.0–100.0)

## 2022-02-24 NOTE — ED Triage Notes (Signed)
Pt BIB [facility] from [facility] d/t sudden onset CP that felt like pressure, denies SOB or any other s/s. Gave ASA & 1 Nitro which did help her discomfort. Dementia at baseline, tachy at 112 bpm on their monitor. 20g Lt AC, 119/56, 2L O2 94%, CBG 257.

## 2022-02-24 NOTE — ED Notes (Signed)
Pts o2 is 91 on 2L nasal canula

## 2022-02-24 NOTE — Care Management (Signed)
Received consult patient needing home oxygen to return to [place].  Contacted Jermaine at [employer] the after hours Medical Supply service can arrange for home oxygen set up this evening. Updated patient's son Neddie Steedman  [phone] on the process, Celesta Aver will notify son after processing the oxygen when oxygen is delivered.  Updated Elexes RN and Dr. Alvino Chapel with discharge plan.  Laurena Slimmer RN, BSN  ED Care Manager [phone]

## 2022-02-24 NOTE — ED Provider Notes (Signed)
[facility] EMERGENCY DEPARTMENT Provider Note   CSN: [id number] Arrival date & time: 02/24/22  1406     History  Chief Complaint  Patient presents with   Chest Pain    Taquisha Phung is a 89 y.o. female.  HPI Patient presents from her nursing facility with 2 family members who assist with the history.  Patient's hearing is very poor, and when asked how we are going to help her she requests assistance with dying.  She denies pain.  According to family members the patient was found shaking today, and on vital sign check was found to be hypoxic, with a saturation in the mid 80s.  Normally the patient's oxygen saturation is in the low 90s, though they are unaware of a history of COPD, and she is a non-smoker. With concern for shaking, hypoxic she was brought here for evaluation.  Patient denies any pain, and when asked again about how we can help her she requests assistance with dying.  Family confirms the patient has a DNR status. Report of recent change in medication, fever, cough or other overt illness. Patient had 1 prior similar episode a few months ago, did not have medical evaluation.    Home Medications Prior to Admission medications   Medication Sig Start Date End Date Taking? Authorizing Provider  amLODipine (NORVASC) 10 MG tablet Take 1 tablet (10 mg total) by mouth daily. 08/15/21 08/15/22  Mercy Riding, MD  cephALEXin (KEFLEX) 500 MG capsule 2 caps po bid x 7 days 11/17/21   Deno Etienne, DO  docusate sodium (COLACE) 100 MG capsule Take 1 capsule (100 mg total) by mouth 2 (two) times daily. 08/15/21   Mercy Riding, MD  feeding supplement (ENSURE ENLIVE / ENSURE PLUS) LIQD Take 237 mLs by mouth 2 (two) times daily between meals. 08/15/21   Mercy Riding, MD  ferrous sulfate 325 (65 FE) MG tablet Take 1 tablet (325 mg total) by mouth 2 (two) times daily with a meal. 08/15/21   Mercy Riding, MD  levothyroxine (SYNTHROID, LEVOTHROID) 88 MCG tablet Take 88 mcg by mouth  daily before breakfast.    [provider]  senna-docusate (SENOKOT-S) 8.6-50 MG tablet Take 1 tablet by mouth 2 (two) times daily between meals as needed for moderate constipation. 08/15/21   Mercy Riding, MD  sodium phosphate (FLEET) 7-19 GM/118ML ENEM Place 133 mLs (1 enema total) rectally daily as needed for severe constipation. 08/15/21   Mercy Riding, MD  Vitamin D, Ergocalciferol, (DRISDOL) 1.25 MG (50000 UNIT) CAPS capsule Take 1 capsule (50,000 Units total) by mouth every 7 (seven) days. 08/21/21   Mercy Riding, MD      Allergies    Penicillins, Macrobid [nitrofurantoin], and Sulfa antibiotics    Review of Systems   Review of Systems  Unable to perform ROS: Age    Physical Exam Updated Vital Signs BP 111/64   Pulse 86   Temp 98.3 F (36.8 C)   Resp (!) 21   SpO2 96%  Physical Exam Vitals and nursing note reviewed.  Constitutional:      General: She is not in acute distress.    Comments: Frail-appearing elderly female sitting upright nasal cannula in place.  Very poor hearing is somewhat limiting in terms of interaction, but with specific questions that she answers appropriately  HENT:     Head: Normocephalic and atraumatic.  Eyes:     Conjunctiva/sclera: Conjunctivae normal.  Cardiovascular:  Rate and Rhythm: Normal rate and regular rhythm.  Pulmonary:     Effort: Pulmonary effort is normal. No respiratory distress.     Breath sounds: No stridor.  Abdominal:     General: There is no distension.  Musculoskeletal:     Comments: 1+ edema bilateral shins  Skin:    General: Skin is warm and dry.  Neurological:     Mental Status: She is alert and oriented to person, place, and time.     Cranial Nerves: No cranial nerve deficit.     Motor: Atrophy present. No tremor.  Psychiatric:        Mood and Affect: Mood normal.     ED Results / Procedures / Treatments   Labs (all labs ordered are listed, but only abnormal results are displayed) Labs Reviewed   CBC - Abnormal; Notable for the following components:      Result Value   WBC 18.8 (*)    HCT 46.9 (*)    All other components within normal limits  RESP PANEL BY RT-PCR (RSV, FLU A&B, COVID)  RVPGX2  BRAIN NATRIURETIC PEPTIDE  COMPREHENSIVE METABOLIC PANEL  TROPONIN I (HIGH SENSITIVITY)    EKG EKG Interpretation  Date/Time:  Tuesday February 24 2022 14:16:40 EST Ventricular Rate:  117 PR Interval:  156 QRS Duration: 74 QT Interval:  300 QTC Calculation: 418 R Axis:   253 Text Interpretation: Sinus tachycardia Right atrial enlargement Right superior axis deviation Pulmonary disease pattern Abnormal ECG When compared with ECG of 11-Aug-2021 14:07, PREVIOUS ECG IS PRESENT Confirmed by Gerhard Munch [phone]) on 02/24/2022 3:20:33 PM  Radiology DG Chest 2 View  Result Date: 02/24/2022 CLINICAL DATA:  Sudden onset chest pain. EXAM: CHEST - 2 VIEW COMPARISON:  Chest x-ray dated 11/17/2021 FINDINGS: Heart size and mediastinal contours are stable. Lungs are hyperexpanded. Probable mild bibasilar atelectasis and/or small pleural effusions. Upper lungs are clear. Pneumothorax is seen. Compression fracture deformity within the midthoracic spine, of uncertain age. Additional chronic compression fracture deformity within the upper lumbar spine. No acute-appearing osseous abnormality. IMPRESSION: 1. Probable mild bibasilar atelectasis and/or small pleural effusions. Lungs appear otherwise clear. 2. Hyperexpanded lungs suggesting COPD. 3. Compression fracture deformity within the midthoracic spine, with approximately 80% compression anteriorly, of uncertain age. There is associated kyphosis of the thoracic spine, but no obvious vertebral body retropulsion. Electronically Signed   By: Bary Richard M.D.   On: 02/24/2022 15:20    Procedures Procedures    Medications Ordered in ED Medications - No data to display  ED Course/ Medical Decision Making/ A&P                             Medical  Decision Making Elderly female with history of hypertension presents with family members after his episode of shaking, sound to be hypoxic, in no distress.  Initial evaluation notable for hypoxia as above requiring initiation of nasal cannula oxygen.  Suspicion for pneumonia versus COVID versus flu versus COPD exacerbation though she has no formal history of this.  Patient appears comfortable with initial interventions, on signout is awaiting labs, x-ray, social work assistance.  Social work assistance necessary to ensure oxygen supplementation is available at her facility as the patient, and her family's goal is to return to that location if possible.  Amount and/or Complexity of Data Reviewed Independent Historian: caregiver    Details: Grandson at bedside, also patient's son. External Data Reviewed: notes. Labs: ordered. Decision-making  details documented in ED Course.    Details: Leukocytosis 18,000 Radiology:  Decision-making details documented in ED Course. ECG/medicine tests:  Decision-making details documented in ED Course.  Risk Prescription drug management. Decision regarding hospitalization.  Final Clinical Impression(s) / ED Diagnoses Final diagnoses:  Hypoxia     Carmin Muskrat, MD 02/24/22 1557

## 2022-02-24 NOTE — ED Provider Triage Note (Signed)
Emergency Medicine Provider Triage Evaluation Note  Jennifer Gallegos , a 89 y.o. female  was evaluated in triage.  Pt complains of chest pain.  History provided by EMS, patient seems to be somewhat confused.  She says "I am here because of what is going on."  On repeat.  Reportedly had some chest pain, given aspirin and nitro.  Does not wear oxygen at home and is in the 80s on room air.   Review of Systems  Positive:  Negative:   Physical Exam  BP (!) 153/72 (BP Location: Right Arm)   Pulse (!) 118   Temp 98.3 F (36.8 C)   Resp 20   SpO2 (!) 83%  Gen:   Awake, no distress   Resp:  Normal effort  MSK:   Moves extremities without difficulty  Other:  Appears to have rhinorrhea.  Lung sounds fairly clear Medical Decision Making  Medically screening exam initiated at 2:25 PM.  Appropriate orders placed.  Jennifer Gallegos was informed that the remainder of the evaluation will be completed by another provider, this initial triage assessment does not replace that evaluation, and the importance of remaining in the ED until their evaluation is complete.      Rhae Hammock, PA-C 02/24/22 1427

## 2022-02-24 NOTE — Care Management (Signed)
SATURATION QUALIFICATIONS: (This note is used to comply with regulatory documentation for home oxygen)  Patient Saturations on Room Air at Rest = 95%  Patient Saturations on Room Air while Ambulating = 83%  Patient Saturations on 2 Liters of oxygen while Ambulating = 96%  Please briefly explain why patient needs home oxygen:  Patient is 89 yo and desaturated when ambulated as EDP notes.  Laurena Slimmer RN BSN [phone]

## 2022-02-24 NOTE — ED Provider Notes (Signed)
  Physical Exam  BP 111/64   Pulse 84   Temp 98.3 F (36.8 C)   Resp (!) 24   SpO2 95%   Physical Exam  Procedures  Procedures  ED Course / MDM    Medical Decision Making Amount and/or Complexity of Data Reviewed Labs: ordered.   Patient had oxygen saturations of 83% on room air and require home oxygen.  Oxygen has been set up.  Has at home now.  X-ray reassuring.  Improved on the oxygen.  Negative viral testing.  Does not have edema.  Doubt CHF.  Discharge home.  Discussed with patient's family members.       Davonna Belling, MD 02/24/22 2105

## 2022-02-24 NOTE — ED Notes (Signed)
Per son, pts home oxygen delivered. [facility] called for pt, family updated at bedside

## 2022-02-24 NOTE — Care Management (Signed)
[employer] [phone] after hours line for ETA of home oxygen delivery. They are unable to provide an ETA. Awaiting home oxygen delivery to [place] so patient can return by [facility].

## 2022-02-24 NOTE — Discharge Instructions (Addendum)
You now have oxygen.  The workup today was overall reassuring.  No pneumonia.  Negative viral testing.

## 2022-02-24 NOTE — ED Notes (Signed)
RN reviewed discharge instructions with family and [facility]. Both verbalized understanding and had no further questions. VSS upon discharge

## 2023-01-10 DEATH — deceased
